# Patient Record
Sex: Female | Born: 1980 | Race: Black or African American | Hispanic: No | Marital: Single | Smoking: Current some day smoker
Health system: Southern US, Community
[De-identification: ages and names within clinical notes are randomized; demographics above are authoritative.]

## PROBLEM LIST (undated history)

## (undated) DIAGNOSIS — N939 Abnormal uterine and vaginal bleeding, unspecified: Secondary | ICD-10-CM

## (undated) DIAGNOSIS — D649 Anemia, unspecified: Secondary | ICD-10-CM

## (undated) HISTORY — PX: TUBAL LIGATION: SHX77

---

## 2018-03-31 ENCOUNTER — Encounter (HOSPITAL_COMMUNITY): Payer: Self-pay | Admitting: Emergency Medicine

## 2018-03-31 ENCOUNTER — Other Ambulatory Visit: Payer: Self-pay

## 2018-03-31 ENCOUNTER — Ambulatory Visit (HOSPITAL_COMMUNITY)
Admission: EM | Admit: 2018-03-31 | Discharge: 2018-03-31 | Disposition: A | Discharge status: Home / Self Care | Payer: Medicaid Other | Attending: Family Medicine | Admitting: Family Medicine

## 2018-03-31 DIAGNOSIS — Z3202 Encounter for pregnancy test, result negative: Secondary | ICD-10-CM | POA: Diagnosis not present

## 2018-03-31 DIAGNOSIS — N939 Abnormal uterine and vaginal bleeding, unspecified: Secondary | ICD-10-CM | POA: Diagnosis not present

## 2018-03-31 LAB — POCT I-STAT, CHEM 8
BUN: 10 mg/dL (ref 6–20)
CALCIUM ION: 1.19 mmol/L (ref 1.15–1.40)
CREATININE: 0.8 mg/dL (ref 0.44–1.00)
Chloride: 103 mmol/L (ref 98–111)
GLUCOSE: 99 mg/dL (ref 70–99)
HCT: 24 % — ABNORMAL LOW (ref 36.0–46.0)
HEMOGLOBIN: 8.2 g/dL — AB (ref 12.0–15.0)
Potassium: 3.9 mmol/L (ref 3.5–5.1)
Sodium: 139 mmol/L (ref 135–145)
TCO2: 27 mmol/L (ref 22–32)

## 2018-03-31 MED ORDER — MEDROXYPROGESTERONE ACETATE 10 MG PO TABS: 10.0000 mg | ORAL_TABLET | Freq: Every day | ORAL | 0 refills | Status: DC

## 2018-03-31 NOTE — ED Provider Notes (Signed)
[facility]    CSN: [id number] Arrival date & time: 03/31/18  1142     History   Chief Complaint Chief Complaint  Patient presents with  . Vaginal Bleeding    HPI Savannah Mayer is a 37 y.o. female.   37 year old female comes in for abnormal uterine bleeding.  States about 18 days ago, started having vaginal bleeding, states felt like her usual cycle, though it was 1 week early.  This lasted for about 4 to 5 days where she needed to use 3 tampons a day, which is at baseline.  States her usual cycles lasts about 7 days.  However, after 5 days of vaginal bleeding that is similar to her normal cycle, started having increase in flow, and blood clots.  States has since needed to switch heavy pad every 2 hours in addition to tissues that she puts on top of the pads.  She denies any abdominal pain, nausea, vomiting.  Denies fever, chills, night sweats.  Denies urinary symptoms such as frequency, dysuria, hematuria.  History of tubal ligation with Essure implant 14 years ago without obvious problems.  Denies syncope, but has felt weaker/lightheaded at times.  She has also felt more pale than usual.      History reviewed. No pertinent past medical history.  There are no active problems to display for this patient.   Past Surgical History:  Procedure Laterality Date  . TUBAL LIGATION      OB History   None      Home Medications    Prior to Admission medications   Medication Sig Start Date End Date Taking? Authorizing Provider  medroxyPROGESTERone (PROVERA) 10 MG tablet Take 1 tablet (10 mg total) by mouth daily. 03/31/18   Ok Edwards, PA-C    Family History Family History  Problem Relation Age of Onset  . Anemia Mother   . Healthy Mother     Social History Social History   Tobacco Use  . Smoking status: Current Some Day Smoker    Types: Cigars  . Smokeless tobacco: Never Used  Substance Use Topics  . Alcohol use: Yes  . Drug use: Never     Allergies    Patient has no known allergies.   Review of Systems Review of Systems  Reason unable to perform ROS: See HPI as above.     Physical Exam Triage Vital Signs ED Triage Vitals [03/31/18 1244]  Enc Vitals Group     BP 127/82     Pulse Rate 84     Resp      Temp 98 F (36.7 C)     Temp Source Oral     SpO2 100 %     Weight      Height      Head Circumference      Peak Flow      Pain Score 0     Pain Loc      Pain Edu?      Excl. in [place]?    No data found.  Updated Vital Signs BP 127/82 (BP Location: Left Arm)   Pulse 84   Temp 98 F (36.7 C) (Oral)   LMP 03/17/2018 (Approximate)   SpO2 100%   Physical Exam  Constitutional: She is oriented to person, place, and time. She appears well-developed and well-nourished. No distress.  HENT:  Head: Normocephalic and atraumatic.  Eyes: Pupils are equal, round, and reactive to light. Conjunctivae are normal.  Cardiovascular: Normal rate, regular rhythm and  normal heart sounds. Exam reveals no gallop and no friction rub.  No murmur heard. Pulmonary/Chest: Effort normal and breath sounds normal. She has no wheezes. She has no rales.  Abdominal: Soft. Bowel sounds are normal. She exhibits no mass. There is no tenderness. There is no rebound, no guarding and no CVA tenderness.  Neurological: She is alert and oriented to person, place, and time.  Skin: Skin is warm and dry.  Psychiatric: She has a normal mood and affect. Her behavior is normal. Judgment normal.     UC Treatments / Results  Labs (all labs ordered are listed, but only abnormal results are displayed) Labs Reviewed  POCT I-STAT, CHEM 8 - Abnormal; Notable for the following components:      Result Value   Hemoglobin 8.2 (*)    HCT 24.0 (*)    All other components within normal limits    EKG None  Radiology No results found.  Procedures Procedures (including critical care time)  Medications Ordered in UC Medications - No data to display  Initial  Impression / Assessment and Plan / UC Course  I have reviewed the triage vital signs and the nursing notes.  Pertinent labs & imaging results that were available during my care of the patient were reviewed by me and considered in my medical decision making (see chart for details).    Patient was unable to provide urine sample for pregnancy test. Discussed with Dr Meda Coffee, given history of Essure tubal ligation x 14 years, can defer pregnancy test. Patient with hgb 8.2 on istat, down from 11.9 on 08/2017. Patient is stable, without tachycardia, able to ambulate on own without difficulty. Start provera for abnormal uterine bleeding. Push fluids. Patient to follow up with GYN for further evaluation of abnormal uterine bleeding.  Final Clinical Impressions(s) / UC Diagnoses   Final diagnoses:  Abnormal uterine bleeding    ED Prescriptions    Medication Sig Dispense Auth. Provider   medroxyPROGESTERone (PROVERA) 10 MG tablet Take 1 tablet (10 mg total) by mouth daily. 10 tablet Tobin Chad, PA-C 03/31/18 [phone]

## 2018-03-31 NOTE — ED Notes (Signed)
Patient unable to void at this time. Patient drinking ginger ale

## 2018-03-31 NOTE — Discharge Instructions (Signed)
Start Provera as directed. Keep hydrated, your urine should be clear to pale yellow in color.  Follow-up with GYN for further evaluation of abnormal uterine bleeding.

## 2018-03-31 NOTE — ED Triage Notes (Addendum)
Pt reports vaginal bleeding x18 days.  She states she has had a really heavy flow w/ clotting and extended cycle.  Pt was at work today when she was feeling faint.  Pt does have the Esure tubal implant. She also reports that her mother passed out and had to have transfusions for excessive menstrual bleeding.  She was 52 at the time.

## 2018-04-09 ENCOUNTER — Encounter (HOSPITAL_COMMUNITY): Payer: Self-pay

## 2018-04-09 ENCOUNTER — Emergency Department (HOSPITAL_COMMUNITY)
Admission: EM | Admit: 2018-04-09 | Discharge: 2018-04-10 | Disposition: A | Discharge status: Home / Self Care | Payer: Medicaid Other | Attending: Emergency Medicine | Admitting: Emergency Medicine

## 2018-04-09 DIAGNOSIS — R531 Weakness: Secondary | ICD-10-CM | POA: Diagnosis present

## 2018-04-09 DIAGNOSIS — N939 Abnormal uterine and vaginal bleeding, unspecified: Secondary | ICD-10-CM

## 2018-04-09 DIAGNOSIS — Z79899 Other long term (current) drug therapy: Secondary | ICD-10-CM | POA: Insufficient documentation

## 2018-04-09 DIAGNOSIS — R0602 Shortness of breath: Secondary | ICD-10-CM | POA: Diagnosis not present

## 2018-04-09 DIAGNOSIS — F17210 Nicotine dependence, cigarettes, uncomplicated: Secondary | ICD-10-CM | POA: Insufficient documentation

## 2018-04-09 DIAGNOSIS — D649 Anemia, unspecified: Secondary | ICD-10-CM | POA: Insufficient documentation

## 2018-04-09 LAB — COMPREHENSIVE METABOLIC PANEL
ALBUMIN: 3.8 g/dL (ref 3.5–5.0)
ALT: 12 U/L (ref 0–44)
ANION GAP: 8 (ref 5–15)
AST: 18 U/L (ref 15–41)
Alkaline Phosphatase: 55 U/L (ref 38–126)
BUN: 10 mg/dL (ref 6–20)
CO2: 21 mmol/L — ABNORMAL LOW (ref 22–32)
Calcium: 8.9 mg/dL (ref 8.9–10.3)
Chloride: 106 mmol/L (ref 98–111)
Creatinine, Ser: 0.66 mg/dL (ref 0.44–1.00)
GFR calc Af Amer: >60 mL/min (ref >60)
Glucose, Bld: 86 mg/dL (ref 70–99)
POTASSIUM: 3.8 mmol/L (ref 3.5–5.1)
Sodium: 135 mmol/L (ref 135–145)
TOTAL PROTEIN: 6.7 g/dL (ref 6.5–8.1)
Total Bilirubin: 0.4 mg/dL (ref 0.3–1.2)

## 2018-04-09 LAB — PREPARE RBC (CROSSMATCH)

## 2018-04-09 LAB — ABO/RH: ABO/RH(D): O POS

## 2018-04-09 MED ORDER — SODIUM CHLORIDE 0.9% IV SOLUTION: Freq: Once | INTRAVENOUS | Status: DC

## 2018-04-09 MED ORDER — ACETAMINOPHEN 500 MG PO TABS
1000.0000 mg | ORAL_TABLET | Freq: Once | ORAL | Status: AC
  Administered 2018-04-09: 1000 mg via ORAL
  Filled 2018-04-09: qty 2

## 2018-04-09 NOTE — ED Provider Notes (Signed)
Patient signed out to me at shift change.  Patient presented with headache x1 week.  She states that she has poor posture and has tension in her neck that she believes causes her headache.  She denies any fever or chills.  Denies neck stiffness.  She is tried using Aleve with some relief.  She also reports feeling very fatigued and gets out of breath after walking about 500 feet.  Work-up today shows anemia of 5.7.  Patient reports that she has had abnormal uterine bleeding, which has now stopped.  She states that it stopped last Friday.  She reports that the fatigue has remained.  Patient declines admission, but is willing to receive blood transfusion.  Will treat headache with Tylenol.  Will reassess patient after transfusion.  2:21 AM Patient reassessed, feels significantly improved.  Ambulates around the emergency department without any symptoms.  States she wishes to go home.  Recommend recheck of CBC next week.  Patient understands and agrees with the plan.   Montine Circle, PA-C 04/10/18 Joylene Draft, MD 04/14/18 [phone]

## 2018-04-09 NOTE — ED Triage Notes (Signed)
Pt. Reports being recently treated for menstural bleeding and her hemoglobin was 8 a couple weeks ago. Pt. Reports being on a hormone medication for the bleeding and is now experiencing a severe headache. Pt. Reports shob and diziness. A/O X4

## 2018-04-09 NOTE — ED Provider Notes (Addendum)
[facility] EMERGENCY DEPARTMENT Provider Note   CSN: [id number] Arrival date & time: 04/09/18  1730     History   Chief Complaint No chief complaint on file.   HPI Journey Castonguay is a 37 y.o. female.  The history is provided by the patient.  Weakness  Primary symptoms comment: weak. This is a new problem. The current episode started more than 1 week ago. The problem has been gradually worsening. There was no focality noted. There has been no fever. Associated symptoms include shortness of breath.  Pt reports she was told 3 weeks ago that her hemoglobin was low at 8.8.  Pt has been having heavy periods.  Pt was placed on provera.   Pt reports increasing shortness of breath with exertion.  Pt also complains of a headache and [ain in the back of her neck   History reviewed. No pertinent past medical history.  There are no active problems to display for this patient.   Past Surgical History:  Procedure Laterality Date  . TUBAL LIGATION       OB History   None      Home Medications    Prior to Admission medications   Medication Sig Start Date End Date Taking? Authorizing Provider  medroxyPROGESTERone (PROVERA) 10 MG tablet Take 1 tablet (10 mg total) by mouth daily. 03/31/18   Ok Edwards, PA-C    Family History Family History  Problem Relation Age of Onset  . Anemia Mother   . Healthy Mother     Social History Social History   Tobacco Use  . Smoking status: Current Some Day Smoker    Types: Cigars  . Smokeless tobacco: Never Used  Substance Use Topics  . Alcohol use: Yes  . Drug use: Never     Allergies   Patient has no known allergies.   Review of Systems Review of Systems  Respiratory: Positive for shortness of breath.   Neurological: Positive for weakness.  All other systems reviewed and are negative.    Physical Exam Updated Vital Signs BP 132/73   Pulse 80   Temp 97.9 F (36.6 C) (Oral)   Resp 18   Ht 6\' 1"  (1.854  m)   Wt 78 kg   LMP 03/17/2018 (Approximate)   SpO2 100%   BMI 22.69 kg/m   Physical Exam  Constitutional: She is oriented to person, place, and time. She appears well-developed and well-nourished.  HENT:  Head: Normocephalic.  Pale conjunctiva  Eyes: Pupils are equal, round, and reactive to light. Conjunctivae and EOM are normal.  Neck: Normal range of motion.  Cardiovascular: Normal rate and regular rhythm.  Pulmonary/Chest: Effort normal.  Abdominal: Soft. She exhibits no distension.  Musculoskeletal: Normal range of motion.  Neurological: She is alert and oriented to person, place, and time.  Skin: Skin is warm.  Psychiatric: She has a normal mood and affect.  Nursing note and vitals reviewed.    ED Treatments / Results  Labs (all labs ordered are listed, but only abnormal results are displayed) Labs Reviewed  CBC WITH DIFFERENTIAL/PLATELET - Abnormal; Notable for the following components:      Result Value   WBC 14.6 (*)    RBC 3.02 (*)    Hemoglobin 5.7 (*)    HCT 20.5 (*)    MCV 67.9 (*)    MCH 18.9 (*)    MCHC 27.8 (*)    RDW 19.0 (*)    Platelets 498 (*)  Neutro Abs 11.4 (*)    All other components within normal limits  COMPREHENSIVE METABOLIC PANEL - Abnormal; Notable for the following components:   CO2 21 (*)    All other components within normal limits  TYPE AND SCREEN  PREPARE RBC (CROSSMATCH)    EKG EKG Interpretation  Date/Time:  Wednesday April 09 2018 18:49:48 EST Ventricular Rate:  77 PR Interval:  138 QRS Duration: 72 QT Interval:  378 QTC Calculation: 427 R Axis:   83 Text Interpretation:  Normal sinus rhythm Normal ECG No old tracing to compare Confirmed by Deno Etienne [phone]) on 04/09/2018 6:05:01 PM   Radiology No results found.  Procedures .Critical Care Performed by: Fransico Meadow, PA-C Authorized by: Fransico Meadow, PA-C   Critical care provider statement:    Critical care time (minutes):  45   Critical care  start time:  04/09/2018 7:00 PM   Critical care end time:  04/09/2018 9:07 PM   Critical care was time spent personally by me on the following activities:  Blood draw for specimens, discussions with consultants, discussions with primary provider, ordering and performing treatments and interventions, ordering and review of laboratory studies, ordering and review of radiographic studies, pulse oximetry, evaluation of patient's response to treatment, examination of patient and interpretation of cardiac output measurements   (including critical care time)  Medications Ordered in ED Medications  0.9 %  sodium chloride infusion (Manually program via Guardrails IV Fluids) (has no administration in time range)     Initial Impression / Assessment and Plan / ED Course  I have reviewed the triage vital signs and the nursing notes.  Pertinent labs & imaging results that were available during my care of the patient were reviewed by me and considered in my medical decision making (see chart for details).     Blood transfusion ordered.  Pt refuses admission.  Pt willing to have a transfusion but does not want to be admitted.  Two units ordered.  [facility] for evaluation.  Go to [facility] if further heavy vaginal bleeding  Final Clinical Impressions(s) / ED Diagnoses   Final diagnoses:  Anemia, unspecified type  Abnormal vaginal bleeding    ED Discharge Orders    None    An After Visit Summary was printed and given to the patient.    Fransico Meadow, [place] 04/09/18 2108  Pt's care turned over to [employer] at 11pm.    Fransico Meadow, PA-C 04/09/18 [place], [place], DO 04/12/18 [phone]

## 2018-04-10 LAB — CBC WITH DIFFERENTIAL/PLATELET
ABS IMMATURE GRANULOCYTES: 0 10*3/uL (ref 0.00–0.07)
Abs Immature Granulocytes: 0.05 10*3/uL (ref 0.00–0.07)
BASOS ABS: 0 10*3/uL (ref 0.0–0.1)
BASOS PCT: 0 %
BASOS PCT: 0 %
Basophils Absolute: 0 10*3/uL (ref 0.0–0.1)
EOS ABS: 0.1 10*3/uL (ref 0.0–0.5)
Eosinophils Absolute: 0.1 10*3/uL (ref 0.0–0.5)
Eosinophils Relative: 1 %
Eosinophils Relative: 1 %
HCT: 20.5 % — ABNORMAL LOW (ref 36.0–46.0)
HEMATOCRIT: 25.1 % — AB (ref 36.0–46.0)
HEMOGLOBIN: 5.7 g/dL — AB (ref 12.0–15.0)
Hemoglobin: 7.5 g/dL — ABNORMAL LOW (ref 12.0–15.0)
IMMATURE GRANULOCYTES: 0 %
LYMPHS ABS: 2.5 10*3/uL (ref 0.7–4.0)
Lymphocytes Relative: 17 %
Lymphocytes Relative: 18 %
Lymphs Abs: 2.5 10*3/uL (ref 0.7–4.0)
MCH: 18.9 pg — ABNORMAL LOW (ref 26.0–34.0)
MCH: 21.6 pg — ABNORMAL LOW (ref 26.0–34.0)
MCHC: 27.8 g/dL — AB (ref 30.0–36.0)
MCHC: 29.9 g/dL — ABNORMAL LOW (ref 30.0–36.0)
MCV: 67.9 fL — ABNORMAL LOW (ref 80.0–100.0)
MCV: 72.3 fL — AB (ref 80.0–100.0)
MONOS PCT: 4 %
MONOS PCT: 9 %
Monocytes Absolute: 0.6 10*3/uL (ref 0.1–1.0)
Monocytes Absolute: 1.2 10*3/uL — ABNORMAL HIGH (ref 0.1–1.0)
NEUTROS ABS: 11.4 10*3/uL — AB (ref 1.7–7.7)
NEUTROS ABS: 9.8 10*3/uL — AB (ref 1.7–7.7)
NEUTROS PCT: 78 %
NEUTROS PCT: 72 %
PLATELETS: 498 10*3/uL — AB (ref 150–400)
PLATELETS: 422 10*3/uL — AB (ref 150–400)
RBC: 3.02 MIL/uL — ABNORMAL LOW (ref 3.87–5.11)
RBC: 3.47 MIL/uL — ABNORMAL LOW (ref 3.87–5.11)
RDW: 19 % — AB (ref 11.5–15.5)
RDW: 21.7 % — ABNORMAL HIGH (ref 11.5–15.5)
WBC: 14.6 10*3/uL — ABNORMAL HIGH (ref 4.0–10.5)
WBC: 13.6 10*3/uL — ABNORMAL HIGH (ref 4.0–10.5)
nRBC: 0 % (ref 0.0–0.2)
nRBC: 0 /100 WBC
nRBC: 0 % (ref 0.0–0.2)

## 2018-04-10 LAB — TYPE AND SCREEN
ABO/RH(D): O POS
Antibody Screen: NEGATIVE
UNIT DIVISION: 0
UNIT DIVISION: 0

## 2018-04-10 LAB — BPAM RBC
BLOOD PRODUCT EXPIRATION DATE: 201912252359
Blood Product Expiration Date: 201912252359
ISSUE DATE / TIME: 201911272252
ISSUE DATE / TIME: 201911272054
UNIT TYPE AND RH: 5100
Unit Type and Rh: 5100

## 2018-04-10 MED ORDER — FERROUS SULFATE 325 (65 FE) MG PO TABS: 325.0000 mg | ORAL_TABLET | Freq: Every day | ORAL | 0 refills | Status: DC

## 2018-04-10 NOTE — ED Notes (Signed)
Pt ambulated around the pod on pulse ox with no complaints at this time. Pt remained between 100% and 97% while ambulating. Pt stated that she could tell that she felt better after the blood transfusions. Nurse and MD made aware

## 2019-01-15 ENCOUNTER — Observation Stay (HOSPITAL_COMMUNITY)
Admission: EM | Admit: 2019-01-15 | Discharge: 2019-01-16 | Disposition: A | Discharge status: Home / Self Care | Payer: Medicaid Other | Attending: Obstetrics & Gynecology | Admitting: Obstetrics & Gynecology

## 2019-01-15 ENCOUNTER — Other Ambulatory Visit: Payer: Self-pay

## 2019-01-15 ENCOUNTER — Observation Stay (HOSPITAL_COMMUNITY): Payer: Medicaid Other

## 2019-01-15 DIAGNOSIS — N83202 Unspecified ovarian cyst, left side: Secondary | ICD-10-CM | POA: Insufficient documentation

## 2019-01-15 DIAGNOSIS — N939 Abnormal uterine and vaginal bleeding, unspecified: Secondary | ICD-10-CM

## 2019-01-15 DIAGNOSIS — N83291 Other ovarian cyst, right side: Secondary | ICD-10-CM | POA: Diagnosis not present

## 2019-01-15 DIAGNOSIS — R42 Dizziness and giddiness: Secondary | ICD-10-CM | POA: Diagnosis not present

## 2019-01-15 DIAGNOSIS — F17290 Nicotine dependence, other tobacco product, uncomplicated: Secondary | ICD-10-CM | POA: Diagnosis not present

## 2019-01-15 DIAGNOSIS — Z20828 Contact with and (suspected) exposure to other viral communicable diseases: Secondary | ICD-10-CM | POA: Insufficient documentation

## 2019-01-15 DIAGNOSIS — D649 Anemia, unspecified: Secondary | ICD-10-CM | POA: Diagnosis not present

## 2019-01-15 DIAGNOSIS — N938 Other specified abnormal uterine and vaginal bleeding: Principal | ICD-10-CM | POA: Diagnosis present

## 2019-01-15 LAB — URINALYSIS, ROUTINE W REFLEX MICROSCOPIC
Bilirubin Urine: NEGATIVE
Glucose, UA: NEGATIVE mg/dL
Hgb urine dipstick: NEGATIVE
Ketones, ur: NEGATIVE mg/dL
Leukocytes,Ua: NEGATIVE
Nitrite: NEGATIVE
Protein, ur: NEGATIVE mg/dL
Specific Gravity, Urine: 1.018 (ref 1.005–1.030)
pH: 7 (ref 5.0–8.0)

## 2019-01-15 LAB — I-STAT BETA HCG BLOOD, ED (MC, WL, AP ONLY): I-stat hCG, quantitative: <5 m[IU]/mL (ref <5)

## 2019-01-15 LAB — COMPREHENSIVE METABOLIC PANEL
ALT: 11 U/L (ref 0–44)
AST: 15 U/L (ref 15–41)
Albumin: 3.4 g/dL — ABNORMAL LOW (ref 3.5–5.0)
Alkaline Phosphatase: 60 U/L (ref 38–126)
Anion gap: 8 (ref 5–15)
BUN: 9 mg/dL (ref 6–20)
CO2: 23 mmol/L (ref 22–32)
Calcium: 8.7 mg/dL — ABNORMAL LOW (ref 8.9–10.3)
Chloride: 106 mmol/L (ref 98–111)
Creatinine, Ser: 0.77 mg/dL (ref 0.44–1.00)
GFR calc Af Amer: >60 mL/min (ref >60)
GFR calc non Af Amer: >60 mL/min (ref >60)
Glucose, Bld: 92 mg/dL (ref 70–99)
Potassium: 4 mmol/L (ref 3.5–5.1)
Sodium: 137 mmol/L (ref 135–145)
Total Bilirubin: 0.3 mg/dL (ref 0.3–1.2)
Total Protein: 6.2 g/dL — ABNORMAL LOW (ref 6.5–8.1)

## 2019-01-15 LAB — CBC
HCT: 21 % — ABNORMAL LOW (ref 36.0–46.0)
Hemoglobin: 5.9 g/dL — CL (ref 12.0–15.0)
MCH: 18.4 pg — ABNORMAL LOW (ref 26.0–34.0)
MCHC: 28.1 g/dL — ABNORMAL LOW (ref 30.0–36.0)
MCV: 65.6 fL — ABNORMAL LOW (ref 80.0–100.0)
Platelets: 497 10*3/uL — ABNORMAL HIGH (ref 150–400)
RBC: 3.2 MIL/uL — ABNORMAL LOW (ref 3.87–5.11)
RDW: 20.6 % — ABNORMAL HIGH (ref 11.5–15.5)
WBC: 11.3 10*3/uL — ABNORMAL HIGH (ref 4.0–10.5)
nRBC: 0.3 % — ABNORMAL HIGH (ref 0.0–0.2)

## 2019-01-15 LAB — PROTIME-INR
INR: 1.1 (ref 0.8–1.2)
Prothrombin Time: 13.9 seconds (ref 11.4–15.2)

## 2019-01-15 LAB — WET PREP, GENITAL
Clue Cells Wet Prep HPF POC: NONE SEEN
Sperm: NONE SEEN
Trich, Wet Prep: NONE SEEN
WBC, Wet Prep HPF POC: NONE SEEN
Yeast Wet Prep HPF POC: NONE SEEN

## 2019-01-15 LAB — SARS CORONAVIRUS 2 BY RT PCR (HOSPITAL ORDER, PERFORMED IN ~~LOC~~ HOSPITAL LAB): SARS Coronavirus 2: NEGATIVE

## 2019-01-15 LAB — APTT: aPTT: 28 seconds (ref 24–36)

## 2019-01-15 LAB — PREPARE RBC (CROSSMATCH)

## 2019-01-15 MED ORDER — ESTROGENS CONJUGATED 25 MG IJ SOLR
25.0000 mg | Freq: Once | INTRAMUSCULAR | Status: AC
  Administered 2019-01-15: 25 mg via INTRAVENOUS
  Filled 2019-01-15: qty 25

## 2019-01-15 MED ORDER — MEGESTROL ACETATE 40 MG PO TABS
40.0000 mg | ORAL_TABLET | Freq: Every day | ORAL | Status: DC
  Administered 2019-01-15 – 2019-01-16 (×2): 40 mg via ORAL
  Filled 2019-01-15 (×2): qty 1

## 2019-01-15 MED ORDER — ACETAMINOPHEN 325 MG PO TABS: 650.0000 mg | ORAL_TABLET | Freq: Once | ORAL | Status: DC

## 2019-01-15 MED ORDER — STERILE WATER FOR INJECTION IJ SOLN
INTRAMUSCULAR | Status: AC
  Administered 2019-01-15: 23:00:00
  Filled 2019-01-15: qty 10

## 2019-01-15 MED ORDER — SODIUM CHLORIDE 0.9% IV SOLUTION
Freq: Once | INTRAVENOUS | Status: AC
  Administered 2019-01-16: 02:00:00 via INTRAVENOUS

## 2019-01-15 MED ORDER — PRENATAL MULTIVITAMIN CH
1.0000 | ORAL_TABLET | Freq: Every day | ORAL | Status: DC
  Administered 2019-01-16: 1 via ORAL
  Filled 2019-01-15: qty 1

## 2019-01-15 NOTE — ED Triage Notes (Signed)
Pt presents with bright red vaginal bleeding x3 weeks. Reports SOB with exertion, takes iron pills

## 2019-01-15 NOTE — H&P (Signed)
Savannah Mayer is an 38 y.o. female G6P5A1 LMP 12/26/18 with past medical history significant for anemia, dysfunctional uterine bleeding, who presents for evaluation of lightheadedness.  Patient states she has had her menstrual cycle x3 weeks. States she is having to change her pad approximately every 1-2 hours.  Patient states she is having clots the size of quarters.  Patient states her bleeding has slowed down somewhat since it began however she is still changing her pads less than every 2 hours.  She has had some dizziness and gets short of breath since her vaginal bleeding began.  States she does have a headache located to her posterior head.  Patient states this is a similar headache that she got in November of last year when he was diagnosed with anemia.  She denies sudden onset thunderclap headache, neck pain, neck stiffness, unilateral weakness, blurred vision.  Patient describes the pain as dull and nagging.  Denies chest pain, hemoptysis, fever, chills, abdominal pain, diarrhea dysuria.  Patient states she is sexually active and intermittently uses protection.  States prior to her vaginal bleeding began she did have a thick vaginal discharge.  Was previously on Provera for vaginal bleeding.  Not followed by PCP or OB/GYN.  She has a prior history of tubal ligation.  Denies additional aggravating or alleviating factors. Pertinent Gynecological History: Bleeding: dysfunctional uterine bleeding Contraception: tubal ligation Essure and PP BTL 10 years ago Blood transfusions: 2 units PRBC 03/2018 Sexually transmitted diseases: no past history Previous GYN Procedures: Essure, BTL    Menstrual History:  Patient's last menstrual period was 01/15/2019.  Menses generally are every 30 days and last 4 days without pain  No past medical history on file.  Past Surgical History:  Procedure Laterality Date  . TUBAL LIGATION    Essure  Family History  Problem Relation Age of Onset  . Anemia Mother    . Healthy Mother     Social History:  reports that she has been smoking cigars. She has never used smokeless tobacco. She reports current alcohol use. She reports that she does not use drugs.  Allergies: No Known Allergies  (Not in a hospital admission)   ROS  Blood pressure 113/69, pulse 83, temperature 98.3 F (36.8 C), temperature source Oral, resp. rate 16, height 6\' 1"  (1.854 m), weight 83.9 kg, last menstrual period 01/15/2019, SpO2 100 %. Physical Exam  Vitals reviewed. Constitutional: She is oriented to person, place, and time. She appears well-developed. No distress.  HENT:  Head: Normocephalic.  Neck: Normal range of motion. Neck supple.  Cardiovascular: Normal rate.  Respiratory: She is in respiratory distress.  GI: Soft. She exhibits no distension and no mass. There is no abdominal tenderness.  Musculoskeletal: Normal range of motion.  Neurological: She is alert and oriented to person, place, and time.  Skin: Skin is warm and dry. There is pallor.    Results for orders placed or performed during the hospital encounter of 01/15/19 (from the past 24 hour(s))  CBC     Status: Abnormal   Collection Time: 01/15/19  5:45 PM  Result Value Ref Range   WBC 11.3 (H) 4.0 - 10.5 K/uL   RBC 3.20 (L) 3.87 - 5.11 MIL/uL   Hemoglobin 5.9 (LL) 12.0 - 15.0 g/dL   HCT 21.0 (L) 36.0 - 46.0 %   MCV 65.6 (L) 80.0 - 100.0 fL   MCH 18.4 (L) 26.0 - 34.0 pg   MCHC 28.1 (L) 30.0 - 36.0 g/dL   RDW 20.6 (  H) 11.5 - 15.5 %   Platelets 497 (H) 150 - 400 K/uL   nRBC 0.3 (H) 0.0 - 0.2 %  I-Stat beta hCG blood, ED     Status: None   Collection Time: 01/15/19  5:59 PM  Result Value Ref Range   I-stat hCG, quantitative <5.0 <5 mIU/mL   Comment 3          Comprehensive metabolic panel     Status: Abnormal   Collection Time: 01/15/19  7:20 PM  Result Value Ref Range   Sodium 137 135 - 145 mmol/L   Potassium 4.0 3.5 - 5.1 mmol/L   Chloride 106 98 - 111 mmol/L   CO2 23 22 - 32 mmol/L    Glucose, Bld 92 70 - 99 mg/dL   BUN 9 6 - 20 mg/dL   Creatinine, Ser 0.77 0.44 - 1.00 mg/dL   Calcium 8.7 (L) 8.9 - 10.3 mg/dL   Total Protein 6.2 (L) 6.5 - 8.1 g/dL   Albumin 3.4 (L) 3.5 - 5.0 g/dL   AST 15 15 - 41 U/L   ALT 11 0 - 44 U/L   Alkaline Phosphatase 60 38 - 126 U/L   Total Bilirubin 0.3 0.3 - 1.2 mg/dL   GFR calc non Af Amer >60 >60 mL/min   GFR calc Af Amer >60 >60 mL/min   Anion gap 8 5 - 15  Protime-INR     Status: None   Collection Time: 01/15/19  7:20 PM  Result Value Ref Range   Prothrombin Time 13.9 11.4 - 15.2 seconds   INR 1.1 0.8 - 1.2  PTT     Status: None   Collection Time: 01/15/19  7:20 PM  Result Value Ref Range   aPTT 28 24 - 36 seconds  Type and screen [facility]     Status: None   Collection Time: 01/15/19  7:30 PM  Result Value Ref Range   ABO/RH(D) O POS    Antibody Screen POS    Sample Expiration      01/18/2019,2359 Performed at [facility], [address] [place], [place] [address]   Prepare RBC     Status: None   Collection Time: 01/15/19  7:30 PM  Result Value Ref Range   Order Confirmation      ORDER PROCESSED BY BLOOD BANK Performed at [facility], [place] [address] [place],  [address]   SARS Coronavirus 2 [facility] order, Performed in [facility] hospital lab) Nasopharyngeal Nasopharyngeal Swab     Status: None   Collection Time: 01/15/19  7:30 PM   Specimen: Nasopharyngeal Swab  Result Value Ref Range   SARS Coronavirus 2 NEGATIVE NEGATIVE  Urinalysis, Routine w reflex microscopic     Status: Abnormal   Collection Time: 01/15/19  8:20 PM  Result Value Ref Range   Color, Urine YELLOW YELLOW   APPearance HAZY (A) CLEAR   Specific Gravity, Urine 1.018 1.005 - 1.030   pH 7.0 5.0 - 8.0   Glucose, UA NEGATIVE NEGATIVE mg/dL   Hgb urine dipstick NEGATIVE NEGATIVE   Bilirubin Urine NEGATIVE NEGATIVE   Ketones, ur NEGATIVE NEGATIVE mg/dL   Protein, ur NEGATIVE NEGATIVE mg/dL   Nitrite  NEGATIVE NEGATIVE   Leukocytes,Ua NEGATIVE NEGATIVE    No results found.  Assessment/Plan: DUB with anemia requiring transfusion. 2 units PRBC was ordered and she will receive IV Premarin and oral Megace to control her current bleeding. Pelvic US ordered for tomorrow morning.  Emeterio Reeve 01/15/2019, 9:00 PM

## 2019-01-15 NOTE — ED Provider Notes (Signed)
[facility] DEPARTMENT Provider Note   CSN: [id number] Arrival date & time: 01/15/19  1720   History   Chief Complaint Chief Complaint  Patient presents with   Vaginal Bleeding   HPI Savannah Mayer is a 38 y.o. female with past medical history significant for anemia, dysfunctional uterine bleeding, who presents for evaluation of lightheadedness.  Patient states she has had her menstrual cycle x3 weeks. States she is having to change her pad approximately every 1-2 hours.  Patient states she is having clots the size of quarters.  Patient states her bleeding has slowed down somewhat since it began however she is still changing her pads less than every 2 hours.  She has had some dizziness and gets short of breath since her vaginal bleeding began.  States she does have a headache located to her posterior head.  Patient states this is a similar headache that she got in November of last year when he was diagnosed with anemia.  She denies sudden onset thunderclap headache, neck pain, neck stiffness, unilateral weakness, blurred vision.  Patient describes the pain as dull and nagging.  Denies chest pain, hemoptysis, fever, chills, abdominal pain, diarrhea dysuria.  Patient states she is sexually active and intermittently uses protection.  States prior to her vaginal bleeding began she did have a thick vaginal discharge.  Was previously on Provera for vaginal bleeding.  Not followed by PCP or OB/GYN.  She has a prior history of tubal ligation.  Denies additional aggravating or alleviating factors.  History obtained from patient and past medical records.  No interpreter was used.     HPI  No past medical history on file.  Patient Active Problem List   Diagnosis Date Noted   DUB (dysfunctional uterine bleeding) 01/15/2019    Past Surgical History:  Procedure Laterality Date   TUBAL LIGATION       OB History   No obstetric history on file.      Home  Medications    Prior to Admission medications   Medication Sig Start Date End Date Taking? Authorizing Provider  acetaminophen (TYLENOL) 325 MG suppository Place 325 mg rectally every 4 (four) hours as needed (for headache).   Yes [provider]  BIOTIN PO Take 1 tablet by mouth daily.   Yes [provider]  ferrous sulfate 325 (65 FE) MG tablet Take 1 tablet (325 mg total) by mouth daily. 04/10/18  Yes Montine Circle, PA-C  ibuprofen (ADVIL,MOTRIN) 200 MG tablet Take 200 mg by mouth every 6 (six) hours as needed for headache.   Yes [provider]  naproxen sodium (ALEVE) 220 MG tablet Take 220 mg by mouth 2 (two) times daily as needed (for headache).   Yes [provider]    Family History Family History  Problem Relation Age of Onset   Anemia Mother    Healthy Mother     Social History Social History   Tobacco Use   Smoking status: Current Some Day Smoker    Types: Cigars   Smokeless tobacco: Never Used  Substance Use Topics   Alcohol use: Yes   Drug use: Never     Allergies   Patient has no known allergies.   Review of Systems Review of Systems  Constitutional: Negative.   HENT: Negative.   Respiratory: Negative.   Cardiovascular: Negative.   Gastrointestinal: Negative.   Genitourinary: Negative.   Musculoskeletal: Negative.   Skin: Negative.   Neurological: Positive for dizziness, light-headedness and headaches.  Negative for tremors, seizures, syncope, speech difficulty, weakness and numbness.  All other systems reviewed and are negative.    Physical Exam Updated Vital Signs BP 128/71 (BP Location: Right Arm)    Pulse 67    Temp 98.3 F (36.8 C) (Oral)    Resp 12    Ht 6\' 1"  (1.854 m)    Wt 83.9 kg    LMP 01/15/2019    SpO2 100%    BMI 24.41 kg/m   Physical Exam Vitals signs and nursing note reviewed. Exam conducted with a chaperone present.  Constitutional:      General: She is not in acute distress.     Appearance: She is well-developed. She is not ill-appearing, toxic-appearing or diaphoretic.  HENT:     Head: Normocephalic and atraumatic.     Nose: Nose normal.     Mouth/Throat:     Mouth: Mucous membranes are dry.  Eyes:     Pupils: Pupils are equal, round, and reactive to light.     Comments: Conjunctival pallor. EOM intact. No nystagmus  Neck:     Musculoskeletal: Normal range of motion.     Comments: Full active and passive ROM without pain No midline or paraspinal tenderness No nuchal rigidity or meningeal signs  Cardiovascular:     Rate and Rhythm: Normal rate.     Pulses: Normal pulses.     Heart sounds: Normal heart sounds.  Pulmonary:     Effort: Pulmonary effort is normal. No respiratory distress.     Breath sounds: Normal breath sounds and air entry.  Abdominal:     General: Bowel sounds are normal. There is no distension.     Palpations: Abdomen is soft.     Tenderness: There is no abdominal tenderness.     Comments: Soft, Nontender without rebound or guarding.  Normoactive bowel sounds.  Genitourinary:    Comments: Normal appearing external female genitalia without rashes or lesions, normal vaginal epithelium. Normal appearing cervix with moderate blood at os. No cervical petechiae. Moderate Odor. Bimanual: No CMT, non-tender adenexa.  No palpable adnexal masses or tenderness. Uterus midline and not fixed. Rectovaginal exam was deferred.  No cystocele or rectocele noted. No pelvic lymphadenopathy noted. Wet prep was obtained.  Cultures for gonorrhea and chlamydia collected. Exam performed with chaperone in room. Musculoskeletal: Normal range of motion.     Comments: Moves all 4 extremities without difficulty.  Skin:    General: Skin is warm and dry.     Comments: Appears mildly pale.  No rashes or lesions.  Neurological:     Mental Status: She is alert.     Comments: Mental Status:  Alert, oriented, thought content appropriate. Speech fluent without evidence of  aphasia. Able to follow 2 step commands without difficulty.  Cranial Nerves:  II:  Peripheral visual fields grossly normal, pupils equal, round, reactive to light III,IV, VI: ptosis not present, extra-ocular motions intact bilaterally  V,VII: smile symmetric, facial light touch sensation equal VIII: hearing grossly normal bilaterally  IX,X: midline uvula rise  XI: bilateral shoulder shrug equal and strong XII: midline tongue extension  Motor:  5/5 in upper and lower extremities bilaterally including strong and equal grip strength and dorsiflexion/plantar flexion Sensory: Pinprick and light touch normal in all extremities.  Deep Tendon Reflexes: 2+ and symmetric  Cerebellar: normal finger-to-nose with bilateral upper extremities Gait: normal gait and balance CV: distal pulses palpable throughout      ED Treatments / Results  Labs (all labs ordered  are listed, but only abnormal results are displayed) Labs Reviewed  CBC - Abnormal; Notable for the following components:      Result Value   WBC 11.3 (*)    RBC 3.20 (*)    Hemoglobin 5.9 (*)    HCT 21.0 (*)    MCV 65.6 (*)    MCH 18.4 (*)    MCHC 28.1 (*)    RDW 20.6 (*)    Platelets 497 (*)    nRBC 0.3 (*)    All other components within normal limits  COMPREHENSIVE METABOLIC PANEL - Abnormal; Notable for the following components:   Calcium 8.7 (*)    Total Protein 6.2 (*)    Albumin 3.4 (*)    All other components within normal limits  URINALYSIS, ROUTINE W REFLEX MICROSCOPIC - Abnormal; Notable for the following components:   APPearance HAZY (*)    All other components within normal limits  WET PREP, GENITAL  SARS CORONAVIRUS 2 (HOSPITAL ORDER, [facility])  URINE CULTURE  PROTIME-INR  APTT  RPR  HIV ANTIBODY (ROUTINE TESTING W REFLEX)  I-STAT BETA HCG BLOOD, ED ([facility], [facility], [facility] ONLY)  TYPE AND SCREEN  PREPARE RBC (CROSSMATCH)  GC/CHLAMYDIA PROBE AMP ([place]) NOT AT [facility]     EKG None  Radiology No results found.  Procedures .Critical Care Performed by: Nettie Elm, PA-C Authorized by: Nettie Elm, PA-C   Critical care provider statement:    Critical care time (minutes):  45   Critical care time was exclusive of:  Separately billable procedures and treating other patients and teaching time   Critical care was necessary to treat or prevent imminent or life-threatening deterioration of the following conditions:  Metabolic crisis   Critical care was time spent personally by me on the following activities:  Discussions with consultants, evaluation of patient's response to treatment, examination of patient, ordering and performing treatments and interventions, ordering and review of laboratory studies, ordering and review of radiographic studies, pulse oximetry, re-evaluation of patient's condition, obtaining history from patient or surrogate and review of old charts   (including critical care time)  Medications Ordered in ED Medications  0.9 %  sodium chloride infusion (Manually program via Guardrails IV Fluids) (has no administration in time range)  acetaminophen (TYLENOL) tablet 650 mg (has no administration in time range)  megestrol (MEGACE) tablet 40 mg (has no administration in time range)  conjugated estrogens (PREMARIN) injection 25 mg (has no administration in time range)  prenatal multivitamin tablet 1 tablet (has no administration in time range)   Initial Impression / Assessment and Plan / ED Course  I have reviewed the triage vital signs and the nursing notes.  Pertinent labs & imaging results that were available during my care of the patient were reviewed by me and considered in my medical decision making (see chart for details).  38 year old female appears otherwise well presents for evaluation of dizziness.  Has had an extended menstrual cycle over the last 3 weeks.  She is having to change her pad every 1-2 hours.  She is  having quarter size clots.  No abdominal pain.  She has had a headache however she states normally when she gets anemic she gets this type headache.  She has a nonfocal neurologic exam without neurologic deficits.  She denies sudden onset thunderclap headache.  No neck stiffness or neck rigidity.  No meningismus.  She does appear pale.  Heart and lungs clear.  GU exam  with moderate blood at the office however no CMT tenderness, adnexal tenderness.  Likely due to anemia as she has had this previously.  Presentation non concerning for [facility], ICH, Meningitis, or temporal arteritis. Pt is afebrile with no focal neuro deficits, nuchal rigidity, or change in vision.  CBC with mild leukocytosis at 11.3, hemoglobin 5.9 Pregnancy test negative CMP without significant abnormality. HIV/RPR pending GC pending COVID negative Wet prep negative  Patient with symptomatic anemia. Given Megace in the ED. Discussed risk versus benefit of blood transfusion she is amenable to transfusion and admission to hospital.  Has had similar episode in November which required 2 units of blood.  She has not followed up with OB/GYN.  She does not have PCP follow-up.  2050: Consulted with Dr. Roselie Awkward with OB/GYN who accepts patient for admission.  He request 25 mg of Premarin in addition to Megace.  He will place admission orders.  Covid test is pending on admission.  The patient appears reasonably stabilized for admission considering the current resources, flow, and capabilities available in the ED at this time, and I doubt any other [facility] requiring further screening and/or treatment in the ED prior to admission.        Final Clinical Impressions(s) / ED Diagnoses   Final diagnoses:  Anemia, unspecified type  Abnormal uterine bleeding (AUB)    ED Discharge Orders    None       Jearldean Gutt A, PA-C 01/15/19 2112    Charlesetta Shanks, MD 01/17/19 1639

## 2019-01-15 NOTE — ED Notes (Signed)
Rusty from blood bank called to say that pt's blood is positive for antibodies and therefore will take longer to match/find blood.

## 2019-01-15 NOTE — ED Notes (Signed)
ED TO INPATIENT HANDOFF REPORT  ED Nurse Name and Phone #:  [phone]  S Name/Age/Gender Savannah Mayer 38 y.o. female Room/Bed: 030C/030C  Code Status   Code Status: Full Code  Home/SNF/Other Home Patient oriented to: self, place, time and situation Is this baseline? Yes   Triage Complete: Triage complete  Chief Complaint Low hemoglobin  Triage Note Pt presents with bright red vaginal bleeding x3 weeks. Reports SOB with exertion, takes iron pills    Allergies No Known Allergies  Level of Care/Admitting Diagnosis ED Disposition    ED Disposition Condition [facility] Area: [place] [[id number]]  Level of Care: Med-Surg [16]  Covid Evaluation: Confirmed COVID Negative  Diagnosis: DUB (dysfunctional uterine bleeding) [id number]  Admitting Physician: Rennis Harding  Attending Physician: Woodroe Mode [id number]  PT Class (Do Not Modify): Observation [104]  PT Acc Code (Do Not Modify): Observation [10022]       B Medical/Surgery History No past medical history on file. Past Surgical History:  Procedure Laterality Date  . TUBAL LIGATION       A IV Location/Drains/Wounds Patient Lines/Drains/Airways Status   Active Line/Drains/Airways    Name:   Placement date:   Placement time:   Site:   Days:   Peripheral IV 01/15/19 Left Hand   01/15/19    2000    Hand   less than 1          Intake/Output Last 24 hours No intake or output data in the 24 hours ending 01/15/19 2146  Labs/Imaging Results for orders placed or performed during the hospital encounter of 01/15/19 (from the past 48 hour(s))  CBC     Status: Abnormal   Collection Time: 01/15/19  5:45 PM  Result Value Ref Range   WBC 11.3 (H) 4.0 - 10.5 K/uL   RBC 3.20 (L) 3.87 - 5.11 MIL/uL   Hemoglobin 5.9 (LL) 12.0 - 15.0 g/dL    Comment: REPEATED TO VERIFY Reticulocyte Hemoglobin testing may be clinically indicated, consider ordering this additional test  [id number] THIS CRITICAL RESULT HAS VERIFIED AND BEEN CALLED TO A.SAUNDERS RN BY KATHERINE MCCORMICK ON 09 03 2020 AT 1828, AND HAS BEEN READ BACK.     HCT 21.0 (L) 36.0 - 46.0 %   MCV 65.6 (L) 80.0 - 100.0 fL   MCH 18.4 (L) 26.0 - 34.0 pg   MCHC 28.1 (L) 30.0 - 36.0 g/dL   RDW 20.6 (H) 11.5 - 15.5 %   Platelets 497 (H) 150 - 400 K/uL    Comment: REPEATED TO VERIFY   nRBC 0.3 (H) 0.0 - 0.2 %    Comment: Performed at [facility] [address] [place], [place] [address]  I-Stat beta hCG blood, ED     Status: None   Collection Time: 01/15/19  5:59 PM  Result Value Ref Range   I-stat hCG, quantitative <5.0 <5 mIU/mL   Comment 3            Comment:   GEST. AGE      CONC.  (mIU/mL)   <=1 WEEK        5 - 50     2 WEEKS       50 - 500     3 WEEKS       100 - 10,000     4 WEEKS     1,000 - 30,000        FEMALE AND NON-PREGNANT FEMALE:  LESS THAN 5 mIU/mL   Comprehensive metabolic panel     Status: Abnormal   Collection Time: 01/15/19  7:20 PM  Result Value Ref Range   Sodium 137 135 - 145 mmol/L   Potassium 4.0 3.5 - 5.1 mmol/L   Chloride 106 98 - 111 mmol/L   CO2 23 22 - 32 mmol/L   Glucose, Bld 92 70 - 99 mg/dL   BUN 9 6 - 20 mg/dL   Creatinine, Ser 0.77 0.44 - 1.00 mg/dL   Calcium 8.7 (L) 8.9 - 10.3 mg/dL   Total Protein 6.2 (L) 6.5 - 8.1 g/dL   Albumin 3.4 (L) 3.5 - 5.0 g/dL   AST 15 15 - 41 U/L   ALT 11 0 - 44 U/L   Alkaline Phosphatase 60 38 - 126 U/L   Total Bilirubin 0.3 0.3 - 1.2 mg/dL   GFR calc non Af Amer >60 >60 mL/min   GFR calc Af Amer >60 >60 mL/min   Anion gap 8 5 - 15    Comment: Performed at [facility] [address], [place], [place] [address]  Protime-INR     Status: None   Collection Time: 01/15/19  7:20 PM  Result Value Ref Range   Prothrombin Time 13.9 11.4 - 15.2 seconds   INR 1.1 0.8 - 1.2    Comment: (NOTE) INR goal varies based on device and disease states. Performed at [facility], [place] [address] [place],  [place] [address]   PTT     Status: None   Collection Time: 01/15/19  7:20 PM  Result Value Ref Range   aPTT 28 24 - 36 seconds    Comment: Performed at [facility] [address], [place], [place] [address]  Type and screen [place]     Status: None   Collection Time: 01/15/19  7:30 PM  Result Value Ref Range   ABO/RH(D) O POS    Antibody Screen POS    Sample Expiration      01/18/2019,2359 Performed at [facility], [place] [address] [place], [place] [address]   Prepare RBC     Status: None   Collection Time: 01/15/19  7:30 PM  Result Value Ref Range   Order Confirmation      ORDER PROCESSED BY BLOOD BANK Performed at [facility], [place] [address] [place], [place] [address]   SARS Coronavirus 2 [facility] order, Performed in [facility] hospital lab) Nasopharyngeal Nasopharyngeal Swab     Status: None   Collection Time: 01/15/19  7:30 PM   Specimen: Nasopharyngeal Swab  Result Value Ref Range   SARS Coronavirus 2 NEGATIVE NEGATIVE    Comment: (NOTE) If result is NEGATIVE SARS-CoV-2 target nucleic acids are NOT DETECTED. The SARS-CoV-2 RNA is generally detectable in upper and lower  respiratory specimens during the acute phase of infection. The lowest  concentration of SARS-CoV-2 viral copies this assay can detect is 250  copies / mL. A negative result does not preclude SARS-CoV-2 infection  and should not be used as the sole basis for treatment or other  patient management decisions.  A negative result may occur with  improper specimen collection / handling, submission of specimen other  than nasopharyngeal swab, presence of viral mutation(s) within the  areas targeted by this assay, and inadequate number of viral copies  (<250 copies / mL). A negative result must be combined with clinical  observations, patient history, and epidemiological  information. If result is POSITIVE SARS-CoV-2 target nucleic acids are DETECTED. The SARS-CoV-2 RNA is  generally detectable in upper and lower  respiratory specimens dur ing the acute phase of infection.  Positive  results are indicative of active infection with SARS-CoV-2.  Clinical  correlation with patient history and other diagnostic information is  necessary to determine patient infection status.  Positive results do  not rule out bacterial infection or co-infection with other viruses. If result is PRESUMPTIVE POSTIVE SARS-CoV-2 nucleic acids MAY BE PRESENT.   A presumptive positive result was obtained on the submitted specimen  and confirmed on repeat testing.  While 2019 novel coronavirus  (SARS-CoV-2) nucleic acids may be present in the submitted sample  additional confirmatory testing may be necessary for epidemiological  and / or clinical management purposes  to differentiate between  SARS-CoV-2 and other Sarbecovirus currently known to infect humans.  If clinically indicated additional testing with an alternate test  methodology [id number]) is advised. The SARS-CoV-2 RNA is generally  detectable in upper and lower respiratory sp ecimens during the acute  phase of infection. The expected result is Negative. Fact Sheet for Patients:  StrictlyIdeas.no Fact Sheet for Healthcare Providers: BankingDealers.co.za This test is not yet approved or cleared by the [place] FDA and has been authorized for detection and/or diagnosis of SARS-CoV-2 by FDA under an Emergency Use Authorization (EUA).  This EUA will remain in effect (meaning this test can be used) for the duration of the COVID-19 declaration under Section 564(b)(1) of the Act, 21 U.S.C. section 360bbb-3(b)(1), unless the authorization is terminated or revoked sooner. Performed at [facility], [place] [address] [place], [place] [address]   Urinalysis, Routine w reflex microscopic     Status: Abnormal   Collection Time: 01/15/19  8:20 PM  Result Value Ref Range   Color,  Urine YELLOW YELLOW   APPearance HAZY (A) CLEAR   Specific Gravity, Urine 1.018 1.005 - 1.030   pH 7.0 5.0 - 8.0   Glucose, UA NEGATIVE NEGATIVE mg/dL   Hgb urine dipstick NEGATIVE NEGATIVE   Bilirubin Urine NEGATIVE NEGATIVE   Ketones, ur NEGATIVE NEGATIVE mg/dL   Protein, ur NEGATIVE NEGATIVE mg/dL   Nitrite NEGATIVE NEGATIVE   Leukocytes,Ua NEGATIVE NEGATIVE    Comment: Performed at [facility] [address], [place], [place] [address]  Wet prep, genital     Status: None   Collection Time: 01/15/19  8:30 PM   Specimen: Cervical/Vaginal swab  Result Value Ref Range   Yeast Wet Prep HPF POC NONE SEEN NONE SEEN   Trich, Wet Prep NONE SEEN NONE SEEN   Clue Cells Wet Prep HPF POC NONE SEEN NONE SEEN   WBC, Wet Prep HPF POC NONE SEEN NONE SEEN    Comment: Swab received with less than 0.5 mL of saline, saline added to specimen, interpret results with caution.   Sperm NONE SEEN     Comment: Performed at [facility], [place] [address] [place], [place] [address]   No results found.  Pending Labs Unresulted Labs (From admission, onward)    Start     Ordered   01/15/19 1920  RPR  (STI Panel)  Once,   STAT     01/15/19 1921   01/15/19 1920  HIV antibody  (STI Panel)  Once,   STAT     01/15/19 1921   01/15/19 1920  Urine culture  (STI Panel)  Once,   STAT  01/15/19 1921          Vitals/Pain Today's Vitals   01/15/19 1735 01/15/19 1930 01/15/19 2015 01/15/19 2103  BP:  127/72 113/69 128/71  Pulse:  71 83 67  Resp:  16 16 12   Temp:      TempSrc:      SpO2:  100% 100% 100%  Weight: 83.9 kg     Height: 6\' 1"  (1.854 m)     PainSc: 10-Worst pain ever 0-No pain 0-No pain     Isolation Precautions No active isolations  Medications Medications  0.9 %  sodium chloride infusion (Manually program via Guardrails IV Fluids) (has no administration in time range)  acetaminophen (TYLENOL) tablet 650 mg (has no administration in time range)  megestrol (MEGACE) tablet  40 mg (has no administration in time range)  conjugated estrogens (PREMARIN) injection 25 mg (has no administration in time range)  prenatal multivitamin tablet 1 tablet (has no administration in time range)    Mobility walks     Focused Assessments Genitourinary   R Recommendations: See Admitting Provider Note  Report given to:   Additional Notes:  Pt last pap smear was 10 years ago; no regular gyn. Heavy menstruals.

## 2019-01-16 ENCOUNTER — Observation Stay (HOSPITAL_COMMUNITY): Payer: Medicaid Other

## 2019-01-16 ENCOUNTER — Encounter (HOSPITAL_COMMUNITY): Payer: Self-pay

## 2019-01-16 DIAGNOSIS — N83291 Other ovarian cyst, right side: Secondary | ICD-10-CM | POA: Diagnosis not present

## 2019-01-16 DIAGNOSIS — N938 Other specified abnormal uterine and vaginal bleeding: Secondary | ICD-10-CM | POA: Diagnosis not present

## 2019-01-16 LAB — URINE CULTURE: Culture: NO GROWTH

## 2019-01-16 LAB — CBC
HCT: 27 % — ABNORMAL LOW (ref 36.0–46.0)
Hemoglobin: 8.3 g/dL — ABNORMAL LOW (ref 12.0–15.0)
MCH: 21.8 pg — ABNORMAL LOW (ref 26.0–34.0)
MCHC: 30.7 g/dL (ref 30.0–36.0)
MCV: 70.9 fL — ABNORMAL LOW (ref 80.0–100.0)
Platelets: 441 10*3/uL — ABNORMAL HIGH (ref 150–400)
RBC: 3.81 MIL/uL — ABNORMAL LOW (ref 3.87–5.11)
RDW: 25.1 % — ABNORMAL HIGH (ref 11.5–15.5)
WBC: 11.9 10*3/uL — ABNORMAL HIGH (ref 4.0–10.5)
nRBC: 0.2 % (ref 0.0–0.2)

## 2019-01-16 LAB — TYPE AND SCREEN
ABO/RH(D): O POS
Antibody Screen: POSITIVE

## 2019-01-16 LAB — HIV ANTIBODY (ROUTINE TESTING W REFLEX): HIV Screen 4th Generation wRfx: NONREACTIVE

## 2019-01-16 LAB — RPR: RPR Ser Ql: NONREACTIVE

## 2019-01-16 MED ORDER — FERROUS SULFATE 325 (65 FE) MG PO TABS: 325.0000 mg | ORAL_TABLET | Freq: Every day | ORAL | 3 refills | Status: DC

## 2019-01-16 MED ORDER — MEGESTROL ACETATE 40 MG PO TABS: 40.0000 mg | ORAL_TABLET | Freq: Two times a day (BID) | ORAL | 2 refills | Status: DC

## 2019-01-16 NOTE — ED Notes (Signed)
Patient transported to Ultrasound 

## 2019-01-16 NOTE — Progress Notes (Signed)
Started 2nd unit of blood transfusion, vital signs stable, no reaction noted, will continue to monitor. Will handoff to day nurse CBC post transfusion (RN to place lab order with appropriate draw time).

## 2019-01-16 NOTE — Progress Notes (Signed)
Savannah Mayer to be D/C'd  per MD order. Discussed with the patient and all questions fully answered.  VSS, Skin clean, dry and intact without evidence of skin break down, no evidence of skin tears noted.  IV catheter discontinued intact. Site without signs and symptoms of complications. Dressing and pressure applied.  An After Visit Summary was printed and given to the patient. Patient received prescription.  D/c education completed with patient/family including follow up instructions, medication list, d/c activities limitations if indicated, with other d/c instructions as indicated by MD - patient able to verbalize understanding, all questions fully answered.   Patient instructed to return to ED, call 911, or call MD for any changes in condition.   Patient to be escorted via [place], and D/C home via private auto.

## 2019-01-16 NOTE — Progress Notes (Signed)
0602 1st unit of blood transfusion completed, pt lying comfortably in bed with vital signs stable, no reaction noted, will continue to monitor.

## 2019-01-16 NOTE — Discharge Instructions (Signed)
Abnormal Uterine Bleeding Abnormal uterine bleeding means bleeding more than usual from your uterus. It can include:  Bleeding between periods.  Bleeding after sex.  Bleeding that is heavier than normal.  Periods that last longer than usual.  Bleeding after you have stopped having your period (menopause). There are many problems that may cause this. You should see a doctor for any kind of bleeding that is not normal. Treatment depends on the cause of the bleeding. Follow these instructions at home:  Watch your condition for any changes.  Do not use tampons, douche, or have sex, if your doctor tells you not to.  Change your pads often.  Get regular well-woman exams. Make sure they include a pelvic exam and cervical cancer screening.  Keep all follow-up visits as told by your doctor. This is important. Contact a doctor if:  The bleeding lasts more than one week.  You feel dizzy at times.  You feel like you are going to throw up (nauseous).  You throw up. Get help right away if:  You pass out.  You have to change pads every hour.  You have belly (abdominal) pain.  You have a fever.  You get sweaty.  You get weak.  You passing large blood clots from your vagina. Summary  Abnormal uterine bleeding means bleeding more than usual from your uterus.  There are many problems that may cause this. You should see a doctor for any kind of bleeding that is not normal.  Treatment depends on the cause of the bleeding. This information is not intended to replace advice given to you by your health care provider. Make sure you discuss any questions you have with your health care provider. Document Released: 02/25/2009 Document Revised: 04/24/2016 Document Reviewed: 04/24/2016 Elsevier Patient Education  [place].   Dysfunctional Uterine Bleeding Dysfunctional uterine bleeding is abnormal bleeding from the uterus. Dysfunctional uterine bleeding includes:  A  menstrual period that comes earlier or later than usual.  A menstrual period that is lighter or heavier than usual, or has large blood clots.  Vaginal bleeding between menstrual periods.  Skipping one or more menstrual periods.  Vaginal bleeding after sex.  Vaginal bleeding after menopause. Follow these instructions at home: Eating and drinking   Eat well-balanced meals. Include foods that are high in iron, such as liver, meat, shellfish, green leafy vegetables, and eggs.  To prevent or treat constipation, your health care provider may recommend that you: ? Drink enough fluid to keep your urine pale yellow. ? Take over-the-counter or prescription medicines. ? Eat foods that are high in fiber, such as beans, whole grains, and fresh fruits and vegetables. ? Limit foods that are high in fat and processed sugars, such as fried or sweet foods. Medicines  Take over-the-counter and prescription medicines only as told by your health care provider.  Do not change medicines without talking with your health care provider.  Aspirin or medicines that contain aspirin may make the bleeding worse. Do not take those medicines: ? During the week before your menstrual period. ? During your menstrual period.  If you were prescribed iron pills, take them as told by your health care provider. Iron pills help to replace iron that your body loses because of this condition. Activity  If you need to change your sanitary pad or tampon more than one time every 2 hours: ? Lie in bed with your feet raised (elevated). ? Place a cold pack on your lower abdomen. ? Rest as  much as possible until the bleeding stops or slows down.  Do not try to lose weight until the bleeding has stopped and your blood iron level is back to normal. General instructions   For two months, write down: ? When your menstrual period starts. ? When your menstrual period ends. ? When any abnormal vaginal bleeding occurs. ? What  problems you notice.  Keep all follow up visits as told by your health care provider. This is important. Contact a health care provider if you:  Feel light-headed or weak.  Have nausea and vomiting.  Cannot eat or drink without vomiting.  Feel dizzy or have diarrhea while you are taking medicines.  Are taking birth control pills or hormones, and you want to change them or stop taking them. Get help right away if:  You develop a fever or chills.  You need to change your sanitary pad or tampon more than one time per hour.  Your vaginal bleeding becomes heavier, or your flow contains clots more often.  You develop pain in your abdomen.  You lose consciousness.  You develop a rash. Summary  Dysfunctional uterine bleeding is abnormal bleeding from the uterus.  It includes menstrual bleeding of abnormal duration, volume, or regularity.  Bleeding after sex and after menopause are also considered dysfunctional uterine bleeding. This information is not intended to replace advice given to you by your health care provider. Make sure you discuss any questions you have with your health care provider. Document Released: 04/27/2000 Document Revised: 10/09/2017 Document Reviewed: 10/09/2017 Elsevier Patient Education  2020 [employer].

## 2019-01-16 NOTE — Discharge Summary (Signed)
Physician Discharge Summary  Patient ID: Savannah Mayer MRN: [id number] DOB/AGE: 1981-01-12 38 y.o.  Admit date: 01/15/2019 Discharge date: 01/16/2019  Admission Diagnoses:DUB, anemia  Discharge Diagnoses:  Active Problems:   DUB (dysfunctional uterine bleeding) Anemai  Discharged Condition: fair  Hospital Course: Savannah Mayer is an 38 y.o. female [id number] LMP 12/26/18 withpast medical history significant foranemia,dysfunctional uterine bleeding,who presents for evaluation of lightheadedness. Patient states she has had her menstrual cycle x3 weeks. States she is having to change her pad approximately every 1-2 hours. Patient states she is having clots the size of quarters. Patient states her bleeding has slowed down somewhat since it began however she is still changing her pads less than every 2 hours. She has had some dizziness and gets short of breath since her vaginal bleeding began. States she does have a headache located to her posterior head. Patient states this is a similar headache that she got in November of last year when he was diagnosed with anemia. She denies sudden onset thunderclap headache, neck pain, neck stiffness, unilateral weakness, blurred vision. Patient describes the pain as dull and nagging. Denies chest pain,hemoptysis, fever, chills,abdominal pain, diarrhea dysuria. Patient states she is sexually active and intermittently uses protection. States prior to her vaginal bleeding began she did have a thick vaginal discharge. Was previously on Provera for vaginal bleeding. Not followed by PCP or OB/GYN. She has a prior history of tubal ligation. Denies additional aggravating or alleviating factors. Received 2 u PRBC's with appropriate rise in Hgb. Post transfusion Hgb 8.3.  Pertinent Gynecological History: Bleeding: dysfunctional uterine bleeding Contraception: tubal ligation Essure and PP BTL 10 years ago Blood transfusions: 2 units PRBC 03/2018 Sexually  transmitted diseases: no past history Previous GYN Procedures: Essure, BTL    Menstrual History:  Patient's last menstrual period was 01/15/2019.  Menses generally are every 30 days and last 4 days without pain  No past medical history on file.       Past Surgical History:  Procedure Laterality Date  . TUBAL LIGATION    Essure       Family History  Problem Relation Age of Onset  . Anemia Mother   . Healthy Mother     Social History:  reports that she has been smoking cigars. She has never used smokeless tobacco. She reports current alcohol use. She reports that she does not use drugs.  Allergies: No Known Allergies  (Not in a hospital admission)   ROS  Blood pressure 113/69, pulse 83, temperature 98.3 F (36.8 C), temperature source Oral, resp. rate 16, height 6\' 1"  (1.854 m), weight 83.9 kg, last menstrual period 01/15/2019, SpO2 100 %. Physical Exam  Vitals reviewed. Constitutional: She is oriented to person, place, and time. She appears well-developed. No distress.  HENT:  Head: Normocephalic.  Neck: Normal range of motion. Neck supple.  Cardiovascular: Normal rate.  Respiratory: She is in respiratory distress.  GI: Soft. She exhibits no distension and no mass. There is no abdominal tenderness.  Musculoskeletal: Normal range of motion.  Neurological: She is alert and oriented to person, place, and time.  Skin: Skin is warm and dry. There is pallor.    Lab Results Last 24 Hours        Results for orders placed or performed during the hospital encounter of 01/15/19 (from the past 24 hour(s))  CBC     Status: Abnormal   Collection Time: 01/15/19  5:45 PM  Result Value Ref Range   WBC 11.3 (H) 4.0 -  10.5 K/uL   RBC 3.20 (L) 3.87 - 5.11 MIL/uL   Hemoglobin 5.9 (LL) 12.0 - 15.0 g/dL   HCT 21.0 (L) 36.0 - 46.0 %   MCV 65.6 (L) 80.0 - 100.0 fL   MCH 18.4 (L) 26.0 - 34.0 pg   MCHC 28.1 (L) 30.0 - 36.0 g/dL   RDW 20.6 (H) 11.5 - 15.5 %    Platelets 497 (H) 150 - 400 K/uL   nRBC 0.3 (H) 0.0 - 0.2 %  I-Stat beta hCG blood, ED     Status: None   Collection Time: 01/15/19  5:59 PM  Result Value Ref Range   I-stat hCG, quantitative <5.0 <5 mIU/mL   Comment 3          Comprehensive metabolic panel     Status: Abnormal   Collection Time: 01/15/19  7:20 PM  Result Value Ref Range   Sodium 137 135 - 145 mmol/L   Potassium 4.0 3.5 - 5.1 mmol/L   Chloride 106 98 - 111 mmol/L   CO2 23 22 - 32 mmol/L   Glucose, Bld 92 70 - 99 mg/dL   BUN 9 6 - 20 mg/dL   Creatinine, Ser 0.77 0.44 - 1.00 mg/dL   Calcium 8.7 (L) 8.9 - 10.3 mg/dL   Total Protein 6.2 (L) 6.5 - 8.1 g/dL   Albumin 3.4 (L) 3.5 - 5.0 g/dL   AST 15 15 - 41 U/L   ALT 11 0 - 44 U/L   Alkaline Phosphatase 60 38 - 126 U/L   Total Bilirubin 0.3 0.3 - 1.2 mg/dL   GFR calc non Af Amer >60 >60 mL/min   GFR calc Af Amer >60 >60 mL/min   Anion gap 8 5 - 15  Protime-INR     Status: None   Collection Time: 01/15/19  7:20 PM  Result Value Ref Range   Prothrombin Time 13.9 11.4 - 15.2 seconds   INR 1.1 0.8 - 1.2  PTT     Status: None   Collection Time: 01/15/19  7:20 PM  Result Value Ref Range   aPTT 28 24 - 36 seconds  Type and screen [place]     Status: None   Collection Time: 01/15/19  7:30 PM  Result Value Ref Range   ABO/RH(D) O POS    Antibody Screen POS    Sample Expiration      01/18/2019,2359 Performed at [facility], [address] [place], [place] [address]   Prepare RBC     Status: None   Collection Time: 01/15/19  7:30 PM  Result Value Ref Range   Order Confirmation      ORDER PROCESSED BY BLOOD BANK Performed at [facility], [place] [address], [place], [place] [address]   SARS Coronavirus 2 [facility] order, Performed in [facility] hospital lab) Nasopharyngeal Nasopharyngeal Swab     Status: None   Collection Time: 01/15/19  7:30 PM   Specimen: Nasopharyngeal Swab   Result Value Ref Range   SARS Coronavirus 2 NEGATIVE NEGATIVE  Urinalysis, Routine w reflex microscopic     Status: Abnormal   Collection Time: 01/15/19  8:20 PM  Result Value Ref Range   Color, Urine YELLOW YELLOW   APPearance HAZY (A) CLEAR   Specific Gravity, Urine 1.018 1.005 - 1.030   pH 7.0 5.0 - 8.0   Glucose, UA NEGATIVE NEGATIVE mg/dL   Hgb urine dipstick NEGATIVE NEGATIVE   Bilirubin Urine NEGATIVE NEGATIVE  Ketones, ur NEGATIVE NEGATIVE mg/dL   Protein, ur NEGATIVE NEGATIVE mg/dL   Nitrite NEGATIVE NEGATIVE   Leukocytes,Ua NEGATIVE NEGATIVE      Imaging Results (Last 48 hours)  No results found.    Assessment/Plan: DUB with anemia requiring transfusion. 2 units PRBC was ordered and she will receive IV Premarin and oral Megace to control her current bleeding. Pelvic US ordered for tomorrow morning.  Emeterio Reeve 01/15/2019, 9:00 PM She received 2 units of PRBC and felt better afterward. He bleeding improved during her stay  Consults: None  Significant Diagnostic Studies: labs:  CBC    Component Value Date/Time   WBC 11.9 (H) 01/16/2019 1327   RBC 3.81 (L) 01/16/2019 1327   HGB 8.3 (L) 01/16/2019 1327   HCT 27.0 (L) 01/16/2019 1327   PLT 441 (H) 01/16/2019 1327   MCV 70.9 (L) 01/16/2019 1327   MCH 21.8 (L) 01/16/2019 1327   MCHC 30.7 01/16/2019 1327   RDW 25.1 (H) 01/16/2019 1327   LYMPHSABS 2.5 04/10/2018 0146   MONOABS 1.2 (H) 04/10/2018 0146   EOSABS 0.1 04/10/2018 0146   BASOSABS 0.0 04/10/2018 0146    and radiology: Ultrasound: pelvic CLINICAL DATA:  Initial evaluation for dysfunctional uterine bleeding for 3 weeks.  EXAM: TRANSABDOMINAL AND TRANSVAGINAL ULTRASOUND OF PELVIS  TECHNIQUE: Both transabdominal and transvaginal ultrasound examinations of the pelvis were performed. Transabdominal technique was performed for global imaging of the pelvis including uterus, ovaries, adnexal regions, and pelvic cul-de-sac. It was  necessary to proceed with endovaginal exam following the transabdominal exam to visualize the uterus, endometrium, and ovaries.  COMPARISON:  None  FINDINGS: Uterus  Measurements: 9.8 x 5.5 x 6.0 cm = volume: 169.3 mL. No fibroids or other mass visualized.  Endometrium  Thickness: 11.0 mm.  No focal abnormality visualized.  Right ovary  Measurements: 2.6 x 1.7 x 1.2 cm = volume: 2.9 mL. Normal appearance/no adnexal mass.  Left ovary  Measurements: 4.6 x 3.6 x 3.9 cm = volume: 3.4 mL. 4.1 cm simple cyst with internal daughter cyst, most consistent with a normal physiologic follicular cyst.  Other findings  No abnormal free fluid.  IMPRESSION: 1. Endometrial stripe measures 11 mm in thickness. If bleeding remains unresponsive to hormonal or medical therapy, sonohysterogram should be considered for focal lesion work-up. (Ref: Radiological Reasoning: Algorithmic Workup of Abnormal Vaginal Bleeding with Endovaginal Sonography and Sonohysterography. [id number] [id number]). 2. 4.1 cm simple left ovarian cyst, most consistent with a normal physiologic follicular cyst. 3. Otherwise unremarkable and normal pelvic ultrasound.   Electronically Signed   By: Jeannine Boga M.D.   On: 01/16/2019 00:40  Treatments: IV hydration and transfusion, oral Megace and IV Premarin  Discharge Exam: Blood pressure 110/66, pulse 67, temperature 97.9 F (36.6 C), temperature source Oral, resp. rate 18, height 6\' 1"  (1.854 m), weight 83.9 kg, last menstrual period 01/15/2019, SpO2 100 %. Physical Exam Vitals signs and nursing note reviewed.  Constitutional:      Appearance: Normal appearance.  Cardiovascular:     Rate and Rhythm: Normal rate.  Pulmonary:     Effort: Pulmonary effort is normal.  Abdominal:     General: Abdomen is flat.     Palpations: Abdomen is soft.  Musculoskeletal: Normal range of motion.  Skin:    General: Skin is warm.  Neurological:      Mental Status: She is alert.  Psychiatric:        Mood and Affect: Mood normal.      Disposition: Discharge  disposition: 01-Home or Self Care       Discharge Instructions    Call MD for:  difficulty breathing, headache or visual disturbances   Complete by: As directed    Call MD for:  extreme fatigue   Complete by: As directed    Call MD for:  persistant dizziness or light-headedness   Complete by: As directed    Diet - low sodium heart healthy   Complete by: As directed    Increase activity slowly   Complete by: As directed      Allergies as of 01/16/2019   No Known Allergies     Medication List    TAKE these medications   acetaminophen 325 MG suppository Commonly known as: TYLENOL Place 325 mg rectally every 4 (four) hours as needed (for headache).   BIOTIN PO Take 1 tablet by mouth daily.   ferrous sulfate 325 (65 FE) MG tablet Take 1 tablet (325 mg total) by mouth daily.   ibuprofen 200 MG tablet Commonly known as: ADVIL Take 200 mg by mouth every 6 (six) hours as needed for headache.   megestrol 40 MG tablet Commonly known as: MEGACE Take 1 tablet (40 mg total) by mouth 2 (two) times daily.   naproxen sodium 220 MG tablet Commonly known as: ALEVE Take 220 mg by mouth 2 (two) times daily as needed (for headache).      Follow-up Information    [facility] Follow up in 3 week(s).   Specialty: Obstetrics and Gynecology Why: Dr Shary Decamp information: [address], [address] [place] [phone]          Signed: Donnamae Jude 01/16/2019, 2:11 PM

## 2019-01-16 NOTE — Progress Notes (Signed)
Received pt from ED, alert oriented x4 with VSS and skin intact. Oriented to room, bed controls and plan of care. Left lying comfortably in bed with call bell at reach. Will continue to monitor.   Still waiting for blood bank for transfusion.

## 2019-01-16 NOTE — Plan of Care (Signed)

## 2019-01-17 LAB — BPAM RBC
Blood Product Expiration Date: 202010052359
Blood Product Expiration Date: 202010052359
ISSUE DATE / TIME: 202009040203
ISSUE DATE / TIME: 202009040543
Unit Type and Rh: 5100
Unit Type and Rh: 5100

## 2019-01-17 LAB — TYPE AND SCREEN
ABO/RH(D): O POS
Antibody Screen: POSITIVE
Unit division: 0
Unit division: 0

## 2019-01-17 LAB — GC/CHLAMYDIA PROBE AMP (~~LOC~~) NOT AT ARMC
Chlamydia: NEGATIVE
Neisseria Gonorrhea: NEGATIVE

## 2019-02-18 ENCOUNTER — Encounter: Payer: Self-pay | Admitting: Obstetrics & Gynecology

## 2019-02-18 ENCOUNTER — Ambulatory Visit: Payer: Medicaid Other | Admitting: Obstetrics & Gynecology

## 2019-02-18 ENCOUNTER — Other Ambulatory Visit (HOSPITAL_COMMUNITY)
Admission: RE | Admit: 2019-02-18 | Discharge: 2019-02-18 | Disposition: A | Discharge status: Home / Self Care | Payer: Medicaid Other | Source: Ambulatory Visit | Attending: Obstetrics & Gynecology | Admitting: Obstetrics & Gynecology

## 2019-02-18 ENCOUNTER — Other Ambulatory Visit: Payer: Self-pay

## 2019-02-18 VITALS — BP 129/82 | HR 83 | Ht 73.0 in | Wt 196.1 lb

## 2019-02-18 DIAGNOSIS — Z0000 Encounter for general adult medical examination without abnormal findings: Secondary | ICD-10-CM | POA: Diagnosis not present

## 2019-02-18 DIAGNOSIS — D500 Iron deficiency anemia secondary to blood loss (chronic): Secondary | ICD-10-CM | POA: Diagnosis not present

## 2019-02-18 DIAGNOSIS — N939 Abnormal uterine and vaginal bleeding, unspecified: Secondary | ICD-10-CM | POA: Diagnosis not present

## 2019-02-18 DIAGNOSIS — Z01419 Encounter for gynecological examination (general) (routine) without abnormal findings: Secondary | ICD-10-CM

## 2019-02-18 MED ORDER — MEGESTROL ACETATE 40 MG PO TABS: 40.0000 mg | ORAL_TABLET | Freq: Two times a day (BID) | ORAL | 5 refills | Status: DC

## 2019-02-18 NOTE — Patient Instructions (Signed)
Vaginal Hysterectomy  A vaginal hysterectomy is a procedure to remove all or part of the uterus through a small incision in the vagina. In this procedure, your health care provider may remove your entire uterus, including the lower end (cervix). You may need a vaginal hysterectomy to treat:  Uterine fibroids.  A condition that causes the lining of the uterus to grow in other areas (endometriosis).  Problems with pelvic support.  Cancer of the cervix, ovaries, uterus, or tissue that lines the uterus (endometrium).  Excessive (dysfunctional) uterine bleeding. When removing your uterus, your health care provider may also remove the organs that produce eggs (ovaries) and the tubes that carry eggs to your uterus (fallopian tubes). After a vaginal hysterectomy, you will no longer be able to have a baby. You will also no longer get your menstrual period. Tell a health care provider about:  Any allergies you have.  All medicines you are taking, including vitamins, herbs, eye drops, creams, and over-the-counter medicines.  Any problems you or family members have had with anesthetic medicines.  Any blood disorders you have.  Any surgeries you have had.  Any medical conditions you have.  Whether you are pregnant or may be pregnant. What are the risks? Generally, this is a safe procedure. However, problems may occur, including:  Bleeding.  Infection.  A blood clot that forms in your leg and travels to your lungs (pulmonary embolism).  Damage to surrounding organs.  Pain during sex. What happens before the procedure?  Ask your health care provider what organs will be removed during surgery.  Ask your health care provider about: ? Changing or stopping your regular medicines. This is especially important if you are taking diabetes medicines or blood thinners. ? Taking medicines such as aspirin and ibuprofen. These medicines can thin your blood. Do not take these medicines before your  procedure if your health care provider instructs you not to.  Follow instructions from your health care provider about eating or drinking restrictions.  Do not use any tobacco products, such as cigarettes, chewing tobacco, and e-cigarettes. If you need help quitting, ask your health care provider.  Plan to have someone take you home after discharge from the hospital. What happens during the procedure?  To reduce your risk of infection: ? Your health care team will wash or sanitize their hands. ? Your skin will be washed with soap.  An IV tube will be inserted into one of your veins.  You may be given antibiotic medicine to help prevent infection.  You will be given one or more of the following: ? A medicine to help you relax (sedative). ? A medicine to numb the area (local anesthetic). ? A medicine to make you fall asleep (general anesthetic). ? A medicine that is injected into an area of your body to numb everything beyond the injection site (regional anesthetic).  Your surgeon will make an incision in your vagina.  Your surgeon will locate and remove all or part of your uterus.  Your ovaries and fallopian tubes may be removed at the same time.  The incision will be closed with stitches (sutures) that dissolve over time. The procedure may vary among health care providers and hospitals. What happens after the procedure?  Your blood pressure, heart rate, breathing rate, and blood oxygen level will be monitored often until the medicines you were given have worn off.  You will be encouraged to get up and walk around after a few hours to help prevent complications.    You may have IV tubes in place for a few days.  You will be given pain medicine as needed.  Do not drive for 24 hours if you were given a sedative. This information is not intended to replace advice given to you by your health care provider. Make sure you discuss any questions you have with your health care  provider. Document Released: 08/22/2015 Document Revised: 12/22/2015 Document Reviewed: 05/15/2015 Elsevier Patient Education  2020 [employer]. Hysterectomy Information  A hysterectomy is a surgery in which the uterus is removed. The fallopian tubes and ovaries may be removed (bilateral salpingo-oophorectomy) as well. This procedure may be done to treat various medical problems. After the procedure, a woman will no longer have menstrual periods nor will she be able to become pregnant (sterile). What are the reasons for a hysterectomy? There are many reasons why a woman might have this procedure. They include:  Persistent, abnormal vaginal bleeding.  Long-term (chronic) pelvic pain or infection.  Endometriosis. This is when the lining of the uterus (endometrium) starts to grow outside the uterus.  Adenomyosis. This is when the endometrium starts to grow in the muscle of the uterus.  Pelvic organ prolapse. This is a condition in which the uterus falls down into the vagina.  Noncancerous growths in the uterus (uterine fibroids) that cause symptoms.  The presence of precancerous cells.  Cervical or uterine cancer. What are the different types of hysterectomy? There are three different types of hysterectomy:  Supracervical hysterectomy. In this type, the top part of the uterus is removed, but not the cervix.  Total hysterectomy. In this type, the uterus and cervix are removed.  Radical hysterectomy. In this type, the uterus, the cervix, and the tissue that holds the uterus in place (parametrium) are removed. What are the different ways a hysterectomy can be performed? There are many different ways a hysterectomy can be performed, including:  Abdominal hysterectomy. In this type, an incision is made in the abdomen. The uterus is removed through this incision.  Vaginal hysterectomy. In this type, an incision is made in the vagina. The uterus is removed through this incision. There  are no abdominal incisions.  Conventional laparoscopic hysterectomy. In this type, three or four small incisions are made in the abdomen. A thin, lighted tube with a camera (laparoscope) is inserted into one of the incisions. Other tools are put through the other incisions. The uterus is cut into small pieces. The small pieces are removed through the incisions or through the vagina.  Laparoscopically assisted vaginal hysterectomy (LAVH). In this type, three or four small incisions are made in the abdomen. Part of the surgery is performed laparoscopically and the other part is done vaginally. The uterus is removed through the vagina.  Robot-assisted laparoscopic hysterectomy. In this type, a laparoscope and other tools are inserted into three or four small incisions in the abdomen. A computer-controlled device is used to give the surgeon a 3D image and to help control the surgical instruments. This allows for more precise movements of surgical instruments. The uterus is cut into small pieces and removed through the incisions or removed through the vagina. Discuss the options with your health care provider to determine which type is the right one for you. What are the risks? Generally, this is a safe procedure. However, problems may occur, including:  Bleeding and risk of blood transfusion. Tell your health care provider if you do not want to receive any blood products.  Blood clots in the legs  or lung.  Infection.  Damage to other structures or organs.  Allergic reactions to medicines.  Changing to an abdominal hysterectomy from one of the other techniques. What to expect after a hysterectomy  You will be given pain medicine.  You may need to stay in the hospital for 1- 2 days to recover, depending on the type of hysterectomy you had.  Follow your health care provider's instructions about exercise, driving, and general activities. Ask your health care provider what activities are safe for  you.  You will need to have someone with you for the first 3-5 days after you go home.  You will need to follow up with your surgeon in 2-4 weeks after surgery to evaluate your progress.  If the ovaries are removed, you will have early menopause symptoms such as hot flashes, night sweats, and insomnia.  If you had a hysterectomy for a problem that was not cancer or not a condition that could lead to cancer, then you no longer need Pap tests. However, even if you no longer need a Pap test, a regular pelvic exam is a good idea to make sure no other problems are developing. Questions to ask your health care provider  Is a hysterectomy medically necessary? Do I have other treatment options for my condition?  What are my options for hysterectomy procedure?  What organs and tissues need to be removed?  What are the risks?  What are the benefits?  How long will I need to stay in the hospital after the procedure?  How long will I need to recover at home?  What symptoms can I expect after the procedure? Summary  A hysterectomy is a surgery in which the uterus is removed. The fallopian tubes and ovaries may be removed (bilateral salpingo-oophorectomy) as well.  This procedure may be done to treat various medical problems. After the procedure, a woman will no longer have menstrual periods nor will she be able to become pregnant.  Discuss the options with your health care provider to determine which type of hysterectomy is the right one for you. This information is not intended to replace advice given to you by your health care provider. Make sure you discuss any questions you have with your health care provider. Document Released: 10/24/2000 Document Revised: 04/12/2017 Document Reviewed: 06/06/2016 Elsevier Patient Education  2020 [employer].

## 2019-02-18 NOTE — Progress Notes (Addendum)
Subjective:     Savannah Mayer is a 38 y.o. female here for a routine exam.  [id number] with LMP early Sept. Pt reports AUB. Has had blood transfusion on 2 occasions. Pt has tried to use conservative measures to no avial and wants definitive management.     Gynecologic History Patient's last menstrual period was 12/24/2018 (exact date). Contraception: tubal ligation Last Pap: >5 years prev Last mammogram: n/a  Obstetric History OB History  Gravida Para Term Preterm AB Living  6 5 5  0 1 5  SAB TAB Ectopic Multiple Live Births  1 0 0 0 5    # Outcome Date GA Lbr Len/2nd Weight Sex Delivery Anes PTL Lv  6 SAB           5 Term      Vag-Spont     4 Term      Vag-Spont     3 Term      Vag-Spont     2 Term      Vag-Spont     1 Term      Vag-Spont      The following portions of the patient's history were reviewed and updated as appropriate: allergies, current medications, past family history, past medical history, past social history, past surgical history and problem list.  Review of Systems Pertinent items are noted in HPI.    Objective:  BP 129/82   Pulse 83   Ht 6\' 1"  (1.854 m)   Wt 196 lb 1.6 oz (89 kg)   LMP 12/24/2018 (Exact Date)   BMI 25.87 kg/m   General Appearance:    Alert, cooperative, no distress, appears stated age  Head:    Normocephalic, without obvious abnormality, atraumatic  Eyes:    conjunctiva/corneas clear, EOM's intact, both eyes  Ears:    Normal external ear canals, both ears  Nose:   Nares normal, septum midline, mucosa normal, no drainage    or sinus tenderness  Throat:   Lips, mucosa, and tongue normal; teeth and gums normal  Neck:   Supple, symmetrical, trachea midline, no adenopathy;    thyroid:  no enlargement/tenderness/nodules  Back:     Symmetric, no curvature, ROM normal, no CVA tenderness  Lungs:     respirations unlabored  Chest Wall:    No tenderness or deformity   Heart:    Regular rate and rhythm  Breast Exam:    No tenderness, masses,  or nipple abnormality  Abdomen:     Soft, non-tender, bowel sounds active all four quadrants,    no masses, no organomegaly  Genitalia:    Normal female without lesion, discharge or tenderness   Uterus: mobile with good descensus. 10 weeks sized.   Extremities:   Extremities normal, atraumatic, no cyanosis or edema  Pulses:   2+ and symmetric all extremities  Skin:   Skin color, texture, turgor normal, no rashes or lesions      01/16/2019 CLINICAL DATA:  Initial evaluation for dysfunctional uterine bleeding for 3 weeks.  EXAM: TRANSABDOMINAL AND TRANSVAGINAL ULTRASOUND OF PELVIS  TECHNIQUE: Both transabdominal and transvaginal ultrasound examinations of the pelvis were performed. Transabdominal technique was performed for global imaging of the pelvis including uterus, ovaries, adnexal regions, and pelvic cul-de-sac. It was necessary to proceed with endovaginal exam following the transabdominal exam to visualize the uterus, endometrium, and ovaries.  COMPARISON:  None  FINDINGS: Uterus  Measurements: 9.8 x 5.5 x 6.0 cm = volume: 169.3 mL. No fibroids or other mass visualized.  Endometrium  Thickness: 11.0 mm.  No focal abnormality visualized.  Right ovary  Measurements: 2.6 x 1.7 x 1.2 cm = volume: 2.9 mL. Normal appearance/no adnexal mass.  Left ovary  Measurements: 4.6 x 3.6 x 3.9 cm = volume: 3.4 mL. 4.1 cm simple cyst with internal daughter cyst, most consistent with a normal physiologic follicular cyst.  Other findings  No abnormal free fluid.  IMPRESSION: 1. Endometrial stripe measures 11 mm in thickness. If bleeding remains unresponsive to hormonal or medical therapy, sonohysterogram should be considered for focal lesion work-up. (Ref: Radiological Reasoning: Algorithmic Workup of Abnormal Vaginal Bleeding with Endovaginal Sonography and Sonohysterography. AJR 2008GA:[id number]). 2. 4.1 cm simple left ovarian cyst, most consistent with a  normal physiologic follicular cyst. 3. Otherwise unremarkable and normal pelvic ultrasound. Assessment:    Healthy female exam.   AUB- pt desires definitive management. I have reviewed her options incliuding LnIUD, cont'd oral meds, endometrial ablation and hysterectomy. Pt opts for definitive management with hyst.   Plan:   F/u PAP with hrHPV STI screen including serum TSH and CBC today Keep Megace 40mg  bid until surgery   Patient desires surgical management with TVH with bilateral salpingectomy. The risks of surgery were discussed in detail with the patient including but not limited to: bleeding which may require transfusion or reoperation; infection which may require prolonged hospitalization or re-hospitalization and antibiotic therapy; injury to bowel, bladder, ureters and major vessels or other surrounding organs; need for additional procedures including laparotomy; thromboembolic phenomenon, incisional problems and other postoperative or anesthesia complications.  Patient was told that the likelihood that her condition and symptoms will be treated effectively with this surgical management was very high; the postoperative expectations were also discussed in detail. The patient also understands the alternative treatment options which were discussed in full. All questions were answered.  She was told that she will be contacted by our surgical scheduler regarding the time and date of her surgery; routine preoperative instructions of having nothing to eat or drink after midnight on the day prior to surgery and also coming to the hospital 1 1/2 hours prior to her time of surgery were also emphasized.  She was told she may be called for a preoperative appointment about a week prior to surgery and will be given further preoperative instructions at that visit. Printed patient education handouts about the procedure were given to the patient to review at home.  Pt needs a virtual preop visit prior to  surgery.   Kayliee Atienza L. Harraway-Smith, M.D., [facility] June

## 2019-02-18 NOTE — Progress Notes (Signed)
Pt is here for menorrhagia, pt reports that this has been happening for about a year. Pt was discharged from the hospital on 01/16/2019 after getting blood transfusion. Pt is now taking Megace daily and reports that she has had no further bleeding. Pt reports last pap smear was about 10 years ago. Pt requests STD testing today.

## 2019-02-19 LAB — CBC
Hematocrit: 38.5 % (ref 34.0–46.6)
Hemoglobin: 11.8 g/dL (ref 11.1–15.9)
MCH: 23.6 pg — ABNORMAL LOW (ref 26.6–33.0)
MCHC: 30.6 g/dL — ABNORMAL LOW (ref 31.5–35.7)
MCV: 77 fL — ABNORMAL LOW (ref 79–97)
Platelets: 430 10*3/uL (ref 150–450)
RBC: 5 x10E6/uL (ref 3.77–5.28)
RDW: 26.8 % — ABNORMAL HIGH (ref 11.7–15.4)
WBC: 12 10*3/uL — ABNORMAL HIGH (ref 3.4–10.8)

## 2019-02-19 LAB — HEPATITIS B SURFACE ANTIGEN: Hepatitis B Surface Ag: NEGATIVE

## 2019-02-19 LAB — HIV ANTIBODY (ROUTINE TESTING W REFLEX): HIV Screen 4th Generation wRfx: NONREACTIVE

## 2019-02-19 LAB — HEPATITIS C ANTIBODY: Hep C Virus Ab: <0.1 s/co ratio (ref 0.0–0.9)

## 2019-02-19 LAB — TSH: TSH: 1.46 u[IU]/mL (ref 0.450–4.500)

## 2019-02-19 LAB — RPR: RPR Ser Ql: NONREACTIVE

## 2019-02-26 LAB — CERVICOVAGINAL ANCILLARY ONLY
Bacterial Vaginitis (gardnerella): POSITIVE — AB
Candida Glabrata: NEGATIVE
Candida Vaginitis: NEGATIVE
Chlamydia: NEGATIVE
Comment: NEGATIVE
Comment: NEGATIVE
Comment: NEGATIVE
Comment: NEGATIVE
Comment: NEGATIVE
Comment: NORMAL
Neisseria Gonorrhea: NEGATIVE
Trichomonas: NEGATIVE

## 2019-02-26 LAB — CYTOLOGY - PAP
Comment: NEGATIVE
Diagnosis: NEGATIVE
High risk HPV: NEGATIVE

## 2019-02-27 ENCOUNTER — Other Ambulatory Visit: Payer: Self-pay | Admitting: Obstetrics & Gynecology

## 2019-02-27 ENCOUNTER — Telehealth: Payer: Self-pay

## 2019-02-27 DIAGNOSIS — B9689 Other specified bacterial agents as the cause of diseases classified elsewhere: Secondary | ICD-10-CM

## 2019-02-27 DIAGNOSIS — N760 Acute vaginitis: Secondary | ICD-10-CM

## 2019-02-27 MED ORDER — METRONIDAZOLE 500 MG PO TABS: 500.0000 mg | ORAL_TABLET | Freq: Two times a day (BID) | ORAL | 0 refills | Status: DC

## 2019-02-27 NOTE — Telephone Encounter (Signed)
Called pt regarding positive BV results. Pt made aware that she has BV. Flagyl was sent to the pharmacy. Understanding was voiced. Diamantina Edinger l Moxie Kalil, CMA

## 2019-04-08 ENCOUNTER — Encounter: Payer: Self-pay | Admitting: Obstetrics & Gynecology

## 2019-04-08 ENCOUNTER — Telehealth (INDEPENDENT_AMBULATORY_CARE_PROVIDER_SITE_OTHER): Payer: Medicaid Other | Admitting: Obstetrics & Gynecology

## 2019-04-08 DIAGNOSIS — N938 Other specified abnormal uterine and vaginal bleeding: Secondary | ICD-10-CM | POA: Diagnosis not present

## 2019-04-08 NOTE — Progress Notes (Signed)
Pt is on the phone preparing for a virtual visit with provider. Pt is having a pre op appointment for [facility] with bilateral salpingectomy. Pt reports no more bleeding while taking the Megace BID.

## 2019-04-08 NOTE — Progress Notes (Signed)
TELEHEALTH GYNECOLOGY VIRTUAL VIDEO VISIT ENCOUNTER NOTE  Provider location: [facility] for [facility] at [place]   I connected with Savannah Mayer on 04/08/19 at  8:30 AM EST by MyChart Video Encounter at home and verified that I am speaking with the correct person using two identifiers.   I discussed the limitations, risks, security and privacy concerns of performing an evaluation and management service virtually and the availability of in person appointments. I also discussed with the patient that there may be a patient responsible charge related to this service. The patient expressed understanding and agreed to proceed.   History:  Savannah Mayer is a 38 y.o. [phone] female being evaluated today for AUB. Pt is on Megace now for AUB. The bleeding is improved on Megace. She still desires definitive management. She denies any abnormal vaginal discharge, bleeding, pelvic pain or other concerns.       No past medical history on file. Past Surgical History:  Procedure Laterality Date   TUBAL LIGATION     The following portions of the patient's history were reviewed and updated as appropriate: allergies, current medications, past family history, past medical history, past social history, past surgical history and problem list.   Health Maintenance:  Normal pap and negative HRHPV on 02/18/2019.   Review of Systems:  Pertinent items noted in HPI and remainder of comprehensive ROS otherwise negative.  Physical Exam:   General:  Alert, oriented and cooperative. Patient appears to be in no acute distress.  Mental Status: Normal mood and affect. Normal behavior. Normal judgment and thought content.   Respiratory: Normal respiratory effort, no problems with respiration noted  Rest of physical exam deferred due to type of encounter  Labs and Imaging 01/16/2019 CLINICAL DATA:  Initial evaluation for dysfunctional uterine bleeding for 3 weeks.  EXAM: TRANSABDOMINAL AND TRANSVAGINAL  ULTRASOUND OF PELVIS  TECHNIQUE: Both transabdominal and transvaginal ultrasound examinations of the pelvis were performed. Transabdominal technique was performed for global imaging of the pelvis including uterus, ovaries, adnexal regions, and pelvic cul-de-sac. It was necessary to proceed with endovaginal exam following the transabdominal exam to visualize the uterus, endometrium, and ovaries.  COMPARISON:  None  FINDINGS: Uterus  Measurements: 9.8 x 5.5 x 6.0 cm = volume: 169.3 mL. No fibroids or other mass visualized.  Endometrium  Thickness: 11.0 mm.  No focal abnormality visualized.  Right ovary  Measurements: 2.6 x 1.7 x 1.2 cm = volume: 2.9 mL. Normal appearance/no adnexal mass.  Left ovary  Measurements: 4.6 x 3.6 x 3.9 cm = volume: 3.4 mL. 4.1 cm simple cyst with internal daughter cyst, most consistent with a normal physiologic follicular cyst.  Other findings  No abnormal free fluid.  IMPRESSION: 1. Endometrial stripe measures 11 mm in thickness. If bleeding remains unresponsive to hormonal or medical therapy, sonohysterogram should be considered for focal lesion work-up. (Ref: Radiological Reasoning: Algorithmic Workup of Abnormal Vaginal Bleeding with Endovaginal Sonography and Sonohysterography. AJR [id number]). 2. 4.1 cm simple left ovarian cyst, most consistent with a normal physiologic follicular cyst. 3. Otherwise unremarkable and normal pelvic ultrasound.   Assessment and Plan:     1. DUB (dysfunctional uterine bleeding) Patient desires surgical management with TVH with bilateral salpingectomy.  The risks of surgery were discussed in detail with the patient including but not limited to: bleeding which may require transfusion or reoperation; infection which may require prolonged hospitalization or re-hospitalization and antibiotic therapy; injury to bowel, bladder, ureters and major vessels or other surrounding organs; need  for  additional procedures including laparotomy; thromboembolic phenomenon, incisional problems and other postoperative or anesthesia complications.  Patient was told that the likelihood that her condition and symptoms will be treated effectively with this surgical management was very high; the postoperative expectations were also discussed in detail. The patient also understands the alternative treatment options which were discussed in full. All questions were answered.  She was told that she will be contacted by our surgical scheduler regarding the time and date of her surgery; routine preoperative instructions of having nothing to eat or drink after midnight on the day prior to surgery and also coming to the hospital 1 1/2 hours prior to her time of surgery were also emphasized.  She was told she may be called for a preoperative appointment about a week prior to surgery and will be given further preoperative instructions at that visit. Printed patient education handouts about the procedure were given to the patient to review at home.  Keep megace bid        I discussed the assessment and treatment plan with the patient. The patient was provided an opportunity to ask questions and all were answered. The patient agreed with the plan and demonstrated an understanding of the instructions.   The patient was advised to call back or seek an in-person evaluation/go to the ED if the symptoms worsen or if the condition fails to improve as anticipated.  I provided 15 minutes of face-to-face time during this encounter.   Lavonia Drafts, MD [facility], [place]

## 2019-04-23 ENCOUNTER — Encounter (HOSPITAL_BASED_OUTPATIENT_CLINIC_OR_DEPARTMENT_OTHER): Payer: Self-pay | Admitting: Obstetrics & Gynecology

## 2019-04-23 ENCOUNTER — Other Ambulatory Visit: Payer: Self-pay

## 2019-04-27 ENCOUNTER — Other Ambulatory Visit (HOSPITAL_COMMUNITY)
Admission: RE | Admit: 2019-04-27 | Discharge: 2019-04-27 | Disposition: A | Discharge status: Home / Self Care | Payer: Medicaid Other | Source: Ambulatory Visit | Attending: Obstetrics & Gynecology | Admitting: Obstetrics & Gynecology

## 2019-04-27 DIAGNOSIS — Z20828 Contact with and (suspected) exposure to other viral communicable diseases: Secondary | ICD-10-CM | POA: Diagnosis not present

## 2019-04-27 DIAGNOSIS — Z01812 Encounter for preprocedural laboratory examination: Secondary | ICD-10-CM | POA: Insufficient documentation

## 2019-04-28 LAB — NOVEL CORONAVIRUS, NAA (HOSP ORDER, SEND-OUT TO REF LAB; TAT 18-24 HRS): SARS-CoV-2, NAA: NOT DETECTED

## 2019-04-29 ENCOUNTER — Other Ambulatory Visit: Payer: Self-pay

## 2019-04-29 ENCOUNTER — Encounter (HOSPITAL_BASED_OUTPATIENT_CLINIC_OR_DEPARTMENT_OTHER)
Admission: RE | Admit: 2019-04-29 | Discharge: 2019-04-29 | Disposition: A | Discharge status: Home / Self Care | Payer: Medicaid Other | Source: Ambulatory Visit | Attending: Obstetrics & Gynecology | Admitting: Obstetrics & Gynecology

## 2019-04-29 DIAGNOSIS — N938 Other specified abnormal uterine and vaginal bleeding: Secondary | ICD-10-CM | POA: Diagnosis present

## 2019-04-29 DIAGNOSIS — Z01812 Encounter for preprocedural laboratory examination: Secondary | ICD-10-CM | POA: Diagnosis not present

## 2019-04-29 DIAGNOSIS — D259 Leiomyoma of uterus, unspecified: Secondary | ICD-10-CM | POA: Diagnosis not present

## 2019-04-29 DIAGNOSIS — N838 Other noninflammatory disorders of ovary, fallopian tube and broad ligament: Secondary | ICD-10-CM | POA: Diagnosis not present

## 2019-04-29 DIAGNOSIS — F17290 Nicotine dependence, other tobacco product, uncomplicated: Secondary | ICD-10-CM | POA: Diagnosis not present

## 2019-04-29 LAB — CBC
HCT: 41.5 % (ref 36.0–46.0)
Hemoglobin: 13.6 g/dL (ref 12.0–15.0)
MCH: 27.5 pg (ref 26.0–34.0)
MCHC: 32.8 g/dL (ref 30.0–36.0)
MCV: 83.8 fL (ref 80.0–100.0)
Platelets: 372 10*3/uL (ref 150–400)
RBC: 4.95 MIL/uL (ref 3.87–5.11)
RDW: 15.9 % — ABNORMAL HIGH (ref 11.5–15.5)
WBC: 13 10*3/uL — ABNORMAL HIGH (ref 4.0–10.5)
nRBC: 0 % (ref 0.0–0.2)

## 2019-04-29 LAB — POCT PREGNANCY, URINE: Preg Test, Ur: NEGATIVE

## 2019-04-29 NOTE — Progress Notes (Signed)
Reminded pt to come for labwork, she states she will come today.

## 2019-04-30 ENCOUNTER — Ambulatory Visit (HOSPITAL_COMMUNITY)
Admission: RE | Admit: 2019-04-30 | Discharge: 2019-04-30 | Disposition: A | Discharge status: Home / Self Care | Payer: Medicaid Other | Attending: Obstetrics & Gynecology | Admitting: Obstetrics & Gynecology

## 2019-04-30 ENCOUNTER — Encounter (HOSPITAL_BASED_OUTPATIENT_CLINIC_OR_DEPARTMENT_OTHER)
Admission: RE | Disposition: A | Discharge status: Home / Self Care | Payer: Self-pay | Source: Home / Self Care | Attending: Obstetrics & Gynecology

## 2019-04-30 ENCOUNTER — Ambulatory Visit (HOSPITAL_BASED_OUTPATIENT_CLINIC_OR_DEPARTMENT_OTHER): Payer: Medicaid Other | Admitting: Anesthesiology

## 2019-04-30 ENCOUNTER — Encounter (HOSPITAL_BASED_OUTPATIENT_CLINIC_OR_DEPARTMENT_OTHER): Payer: Self-pay | Admitting: Obstetrics & Gynecology

## 2019-04-30 DIAGNOSIS — N938 Other specified abnormal uterine and vaginal bleeding: Secondary | ICD-10-CM | POA: Diagnosis present

## 2019-04-30 DIAGNOSIS — Z9889 Other specified postprocedural states: Secondary | ICD-10-CM

## 2019-04-30 DIAGNOSIS — F17290 Nicotine dependence, other tobacco product, uncomplicated: Secondary | ICD-10-CM | POA: Diagnosis not present

## 2019-04-30 DIAGNOSIS — D259 Leiomyoma of uterus, unspecified: Secondary | ICD-10-CM | POA: Insufficient documentation

## 2019-04-30 DIAGNOSIS — N838 Other noninflammatory disorders of ovary, fallopian tube and broad ligament: Secondary | ICD-10-CM | POA: Insufficient documentation

## 2019-04-30 HISTORY — PX: VAGINAL HYSTERECTOMY: SHX2639

## 2019-04-30 HISTORY — DX: Anemia, unspecified: D64.9

## 2019-04-30 HISTORY — DX: Abnormal uterine and vaginal bleeding, unspecified: N93.9

## 2019-04-30 SURGERY — HYSTERECTOMY, VAGINAL
Anesthesia: General | Laterality: Bilateral

## 2019-04-30 MED ORDER — IBUPROFEN 800 MG PO TABS: 800.0000 mg | ORAL_TABLET | Freq: Four times a day (QID) | ORAL | Status: DC

## 2019-04-30 MED ORDER — PANTOPRAZOLE SODIUM 40 MG PO TBEC: 40.0000 mg | DELAYED_RELEASE_TABLET | Freq: Every day | ORAL | Status: DC

## 2019-04-30 MED ORDER — SCOPOLAMINE 1 MG/3DAYS TD PT72
MEDICATED_PATCH | TRANSDERMAL | Status: DC | PRN
  Administered 2019-04-30: 1 via TRANSDERMAL

## 2019-04-30 MED ORDER — FENTANYL CITRATE (PF) 100 MCG/2ML IJ SOLN
INTRAMUSCULAR | Status: AC
  Filled 2019-04-30: qty 2

## 2019-04-30 MED ORDER — SOD CITRATE-CITRIC ACID 500-334 MG/5ML PO SOLN
30.0000 mL | ORAL | Status: AC
  Administered 2019-04-30: 07:00:00 30 mL via ORAL

## 2019-04-30 MED ORDER — LACTATED RINGERS IV SOLN: INTRAVENOUS | Status: DC

## 2019-04-30 MED ORDER — KETOROLAC TROMETHAMINE 30 MG/ML IJ SOLN: 30.0000 mg | Freq: Four times a day (QID) | INTRAMUSCULAR | Status: DC

## 2019-04-30 MED ORDER — DOCUSATE SODIUM 100 MG PO CAPS: 100.0000 mg | ORAL_CAPSULE | Freq: Two times a day (BID) | ORAL | 0 refills | Status: AC

## 2019-04-30 MED ORDER — ONDANSETRON HCL 4 MG PO TABS: 4.0000 mg | ORAL_TABLET | Freq: Four times a day (QID) | ORAL | Status: DC | PRN

## 2019-04-30 MED ORDER — HYDROMORPHONE HCL 1 MG/ML IJ SOLN
INTRAMUSCULAR | Status: AC
  Filled 2019-04-30: qty 0.5

## 2019-04-30 MED ORDER — SOD CITRATE-CITRIC ACID 500-334 MG/5ML PO SOLN
ORAL | Status: AC
  Filled 2019-04-30: qty 30

## 2019-04-30 MED ORDER — LIDOCAINE 2% (20 MG/ML) 5 ML SYRINGE
INTRAMUSCULAR | Status: DC | PRN
  Administered 2019-04-30: 80 mg via INTRAVENOUS

## 2019-04-30 MED ORDER — ONDANSETRON HCL 4 MG/2ML IJ SOLN: 4.0000 mg | Freq: Four times a day (QID) | INTRAMUSCULAR | Status: DC | PRN

## 2019-04-30 MED ORDER — BUPIVACAINE HCL (PF) 0.5 % IJ SOLN
INTRAMUSCULAR | Status: DC | PRN
  Administered 2019-04-30: 20 mL

## 2019-04-30 MED ORDER — MIDAZOLAM HCL 5 MG/5ML IJ SOLN
INTRAMUSCULAR | Status: DC | PRN
  Administered 2019-04-30: 2 mg via INTRAVENOUS

## 2019-04-30 MED ORDER — OXYCODONE-ACETAMINOPHEN 5-325 MG PO TABS
1.0000 | ORAL_TABLET | ORAL | Status: DC | PRN
  Administered 2019-04-30: 17:00:00 1 via ORAL
  Filled 2019-04-30: qty 1

## 2019-04-30 MED ORDER — BISACODYL 10 MG RE SUPP: 10.0000 mg | Freq: Every day | RECTAL | Status: DC | PRN

## 2019-04-30 MED ORDER — BUPIVACAINE HCL (PF) 0.5 % IJ SOLN
INTRAMUSCULAR | Status: AC
  Filled 2019-04-30: qty 30

## 2019-04-30 MED ORDER — DEXTROSE-NACL 5-0.45 % IV SOLN: INTRAVENOUS | Status: AC

## 2019-04-30 MED ORDER — ONDANSETRON HCL 4 MG/2ML IJ SOLN
INTRAMUSCULAR | Status: AC
  Filled 2019-04-30: qty 2

## 2019-04-30 MED ORDER — DOCUSATE SODIUM 100 MG PO CAPS: 100.0000 mg | ORAL_CAPSULE | Freq: Two times a day (BID) | ORAL | Status: DC

## 2019-04-30 MED ORDER — ONDANSETRON HCL 4 MG/2ML IJ SOLN
INTRAMUSCULAR | Status: DC | PRN
  Administered 2019-04-30 (×2): 4 mg via INTRAVENOUS

## 2019-04-30 MED ORDER — SENNOSIDES-DOCUSATE SODIUM 8.6-50 MG PO TABS: 1.0000 | ORAL_TABLET | Freq: Every evening | ORAL | Status: DC | PRN

## 2019-04-30 MED ORDER — CEFAZOLIN SODIUM-DEXTROSE 2-4 GM/100ML-% IV SOLN
2.0000 g | INTRAVENOUS | Status: AC
  Administered 2019-04-30: 2 g via INTRAVENOUS

## 2019-04-30 MED ORDER — SIMETHICONE 80 MG PO CHEW: 80.0000 mg | CHEWABLE_TABLET | Freq: Four times a day (QID) | ORAL | Status: DC | PRN

## 2019-04-30 MED ORDER — LIDOCAINE 2% (20 MG/ML) 5 ML SYRINGE
INTRAMUSCULAR | Status: AC
  Filled 2019-04-30: qty 5

## 2019-04-30 MED ORDER — GABAPENTIN 300 MG PO CAPS
300.0000 mg | ORAL_CAPSULE | ORAL | Status: AC
  Administered 2019-04-30: 07:00:00 300 mg via ORAL

## 2019-04-30 MED ORDER — GABAPENTIN 300 MG PO CAPS
ORAL_CAPSULE | ORAL | Status: AC
  Filled 2019-04-30: qty 1

## 2019-04-30 MED ORDER — CEFAZOLIN SODIUM-DEXTROSE 2-4 GM/100ML-% IV SOLN
INTRAVENOUS | Status: AC
  Filled 2019-04-30: qty 100

## 2019-04-30 MED ORDER — KETOROLAC TROMETHAMINE 30 MG/ML IJ SOLN
INTRAMUSCULAR | Status: AC
  Filled 2019-04-30: qty 1

## 2019-04-30 MED ORDER — ROCURONIUM BROMIDE 10 MG/ML (PF) SYRINGE
PREFILLED_SYRINGE | INTRAVENOUS | Status: DC | PRN
  Administered 2019-04-30: 60 mg via INTRAVENOUS

## 2019-04-30 MED ORDER — FENTANYL CITRATE (PF) 100 MCG/2ML IJ SOLN
INTRAMUSCULAR | Status: DC | PRN
  Administered 2019-04-30 (×4): 50 ug via INTRAVENOUS

## 2019-04-30 MED ORDER — ZOLPIDEM TARTRATE 5 MG PO TABS: 5.0000 mg | ORAL_TABLET | Freq: Every evening | ORAL | Status: DC | PRN

## 2019-04-30 MED ORDER — KETOROLAC TROMETHAMINE 30 MG/ML IJ SOLN
INTRAMUSCULAR | Status: DC | PRN
  Administered 2019-04-30: 30 mg via INTRAVENOUS

## 2019-04-30 MED ORDER — OXYCODONE-ACETAMINOPHEN 5-325 MG PO TABS: 1.0000 | ORAL_TABLET | ORAL | 0 refills | Status: AC | PRN

## 2019-04-30 MED ORDER — ACETAMINOPHEN 500 MG PO TABS
ORAL_TABLET | ORAL | Status: AC
  Filled 2019-04-30: qty 2

## 2019-04-30 MED ORDER — MIDAZOLAM HCL 2 MG/2ML IJ SOLN
INTRAMUSCULAR | Status: AC
  Filled 2019-04-30: qty 2

## 2019-04-30 MED ORDER — MEPERIDINE HCL 25 MG/ML IJ SOLN: 6.2500 mg | INTRAMUSCULAR | Status: DC | PRN

## 2019-04-30 MED ORDER — PROMETHAZINE HCL 25 MG/ML IJ SOLN: 6.2500 mg | INTRAMUSCULAR | Status: DC | PRN

## 2019-04-30 MED ORDER — ESTRADIOL 0.1 MG/GM VA CREA
TOPICAL_CREAM | VAGINAL | Status: AC
  Filled 2019-04-30: qty 42.5

## 2019-04-30 MED ORDER — OXYCODONE HCL 5 MG PO TABS: 5.0000 mg | ORAL_TABLET | Freq: Once | ORAL | Status: DC | PRN

## 2019-04-30 MED ORDER — IBUPROFEN 800 MG PO TABS: 800.0000 mg | ORAL_TABLET | Freq: Four times a day (QID) | ORAL | 0 refills | Status: AC

## 2019-04-30 MED ORDER — DEXAMETHASONE SODIUM PHOSPHATE 10 MG/ML IJ SOLN
INTRAMUSCULAR | Status: AC
  Filled 2019-04-30: qty 1

## 2019-04-30 MED ORDER — HYDROMORPHONE HCL 1 MG/ML IJ SOLN: 0.2000 mg | INTRAMUSCULAR | Status: DC | PRN

## 2019-04-30 MED ORDER — MENTHOL 3 MG MT LOZG: 1.0000 | LOZENGE | OROMUCOSAL | Status: DC | PRN

## 2019-04-30 MED ORDER — PROPOFOL 10 MG/ML IV BOLUS
INTRAVENOUS | Status: AC
  Filled 2019-04-30: qty 20

## 2019-04-30 MED ORDER — VASOPRESSIN 20 UNIT/ML IV SOLN
INTRAVENOUS | Status: DC | PRN
  Administered 2019-04-30: 20 mL via INTRAMUSCULAR

## 2019-04-30 MED ORDER — SUGAMMADEX SODIUM 200 MG/2ML IV SOLN
INTRAVENOUS | Status: DC | PRN
  Administered 2019-04-30: 190 mg via INTRAVENOUS

## 2019-04-30 MED ORDER — DEXAMETHASONE SODIUM PHOSPHATE 4 MG/ML IJ SOLN
INTRAMUSCULAR | Status: DC | PRN
  Administered 2019-04-30: 10 mg via INTRAVENOUS

## 2019-04-30 MED ORDER — SODIUM CHLORIDE (PF) 0.9 % IJ SOLN
INTRAMUSCULAR | Status: AC
  Filled 2019-04-30: qty 60

## 2019-04-30 MED ORDER — HYDROMORPHONE HCL 1 MG/ML IJ SOLN
0.2500 mg | INTRAMUSCULAR | Status: DC | PRN
  Administered 2019-04-30: 09:00:00 0.5 mg via INTRAVENOUS

## 2019-04-30 MED ORDER — OXYCODONE HCL 5 MG/5ML PO SOLN: 5.0000 mg | Freq: Once | ORAL | Status: DC | PRN

## 2019-04-30 MED ORDER — ACETAMINOPHEN 500 MG PO TABS
1000.0000 mg | ORAL_TABLET | ORAL | Status: AC
  Administered 2019-04-30: 07:00:00 1000 mg via ORAL

## 2019-04-30 MED ORDER — PROPOFOL 10 MG/ML IV BOLUS
INTRAVENOUS | Status: DC | PRN
  Administered 2019-04-30: 200 mg via INTRAVENOUS

## 2019-04-30 MED ORDER — VASOPRESSIN 20 UNIT/ML IV SOLN
INTRAVENOUS | Status: AC
  Filled 2019-04-30: qty 1

## 2019-04-30 SURGICAL SUPPLY — 20 items
CONT PATH 16OZ SNAP LID 3702 (MISCELLANEOUS) ×2 IMPLANT
GAUZE 4X4 16PLY RFD (DISPOSABLE) ×3 IMPLANT
GLOVE BIO SURGEON STRL SZ7 (GLOVE) ×3 IMPLANT
GLOVE BIOGEL PI IND STRL 6.5 (GLOVE) ×1 IMPLANT
GLOVE BIOGEL PI IND STRL 7.0 (GLOVE) ×3 IMPLANT
GLOVE BIOGEL PI INDICATOR 6.5 (GLOVE) ×2
GLOVE BIOGEL PI INDICATOR 7.0 (GLOVE) ×6
GOWN STRL REUS W/TWL LRG LVL3 (GOWN DISPOSABLE) ×10 IMPLANT
GOWN STRL REUS W/TWL XL LVL3 (GOWN DISPOSABLE) ×3 IMPLANT
NS IRRIG 1000ML POUR BTL (IV SOLUTION) ×3 IMPLANT
PACK VAGINAL WOMENS (CUSTOM PROCEDURE TRAY) ×3 IMPLANT
PAD OB MATERNITY 4.3X12.25 (PERSONAL CARE ITEMS) ×3 IMPLANT
PAD PREP 24X48 CUFFED NSTRL (MISCELLANEOUS) ×3 IMPLANT
SLEEVE SCD COMPRESS KNEE MED (MISCELLANEOUS) ×3 IMPLANT
SUT VIC AB 0 CT1 18XCR BRD8 (SUTURE) ×3 IMPLANT
SUT VIC AB 0 CT1 36 (SUTURE) ×4 IMPLANT
SUT VIC AB 0 CT1 8-18 (SUTURE) ×4
SUT VICRYL 0 TIES 12 18 (SUTURE) ×3 IMPLANT
TOWEL GREEN STERILE FF (TOWEL DISPOSABLE) ×6 IMPLANT
TRAY FOLEY W/BAG SLVR 14FR LF (SET/KITS/TRAYS/PACK) ×3 IMPLANT

## 2019-04-30 NOTE — Op Note (Signed)
04/30/2019  8:47 AM  PATIENT:  Savannah Mayer  38 y.o. female  PRE-OPERATIVE DIAGNOSIS:  ABNORMAL UTERINE BLEEDING  POST-OPERATIVE DIAGNOSIS:  ABNORMAL UTERINE BLEEDING  PROCEDURE:  Procedure(s): HYSTERECTOMY VAGINAL WITH BILATERAL SALPINGECTOMY (Bilateral)  SURGEON:  Surgeon(s) and Role:    * Lavonia Drafts, MD - Primary    * Dove, Myra C, MD  ANESTHESIA:   general and paracervical block  EBL:  50 mL   BLOOD ADMINISTERED:none  DRAINS: none   LOCAL MEDICATIONS USED:  MARCAINE    and OTHER dilute vasopressin solution   SPECIMEN: uterus and fallopian tubes   DISPOSITION OF SPECIMEN:  PATHOLOGY  COUNTS:  YES  TOURNIQUET:  * No tourniquets in log *  DICTATION: .Note written in EPIC  PLAN OF CARE: admit for prolonged observation then discharge later today    PATIENT DISPOSITION:  PACU - hemodynamically stable.   Delay start of Pharmacological VTE agent (>24hrs) due to surgical blood loss or risk of bleeding: not applicable  Complications: none immediate   INDICATIONS: The patient is a 38 y.o. [id number] with history of menorrhagia. The patient made a decision to undergo definite surgical treatment. On the preoperative visit, the risks, benefits, indications, and alternatives of the procedure were reviewed with the patient.  On the day of surgery, the risks of surgery were again discussed with the patient including but not limited to: bleeding which may require transfusion or reoperation; infection which may require antibiotics; injury to bowel, bladder, ureters or other surrounding organs; need for additional procedures; thromboembolic phenomenon, incisional problems and other postoperative/anesthesia complications. Written informed consent was obtained.    OPERATIVE FINDINGS: A 10 week size uterus with normal tubes and ovaries bilaterally.  DESCRIPTION OF PROCEDURE:  The patient received intravenous antibiotics and had sequential compression devices applied to her  lower extremities while in the preoperative area.  She was then taken to the operating room where general anesthesia was administered and was found to be adequate.  She was placed in the dorsal lithotomy position, and was prepped and draped in a sterile manner.  The patients bladder was drained with a red rubber catheter. After an adequate timeout was performed, attention was turned to her pelvis.  A weighted speculum was then placed in the vagina, and the anterior and posterior lips of the cervix were grasped bilaterally with tenaculums.  The cervix was then injected circumferentially with a dilute Vasopression solution.  The cervix was then circumferentially incised, and the bladder was dissected off the pubocervical fascia without complication.  Th posterior cul-de-sac was entered sharply without difficulty. A suture was placed on the posterior vagina.  A long weighted speculum was inserted into the posterior cul-de-sac.  The Heaney clamp was then used to clamp the uterosacral ligaments on either side.  They were then cut and sutured ligated with 0 Vicryl, and the ligated uterosacral ligaments were transfixed to the posterior lateral vaginal epithelium to further support the vagina and provide hemostasis. Of note, all sutures used in this case were 0 Vicryl unless otherwise noted.   The cardinal ligaments were then clamped, cut and ligated. The anterior cul-de-sac was then entered sharpely. The uterine vessels and broad ligaments were then serially clamped with the Heaney clamps, cut, and suture ligated on both sides.  Excellent hemostasis was noted at this point.  Due to the size of the uterus, it was morcellated using a coring technique.  The uterus was then delivered via the posterior cul-de-sac, and the cornua were clamped with the  Heaney clamps, transected, and the uterus was delivered and sent to pathology. These pedicles were then suture ligated to ensure hemostasis.  After completion of the hysterectomy,  The fallopian tube on the left side was grasped with a Kelly clamp, transected and suture ligated a similar process occurred on the right allowing for bilateral salpingectomy.  All pedicles from the uterosacral ligament to the cornua were examined hemostasis was confirmed.  The vaginal cuff was reefed in a running locked fashion then reapproximated using figure of eight sutures care was given to incorporate the uterosacral pedicles bilaterally.  All instruments were then removed from the pelvis. The patient tolerated the procedure well.  All instruments, needles, and sponge counts were correct x 2. The patient was taken to the recovery room in stable condition.     An experienced assistant was required given the standard of surgical care given the complexity of the case.  This assistant was needed for exposure, dissection, suctioning, retraction, instrument exchange, assisting with delivery with administration of fundal pressure, and for overall help during the procedure.  Allyn Bertoni L. Harraway-Smith, M.D., [facility]

## 2019-04-30 NOTE — Anesthesia Postprocedure Evaluation (Signed)
Anesthesia Post Note  Patient: Savannah Mayer  Procedure(s) Performed: HYSTERECTOMY VAGINAL WITH BILATERAL SALPINGECTOMY (Bilateral )     Patient location during evaluation: PACU Anesthesia Type: General Level of consciousness: awake and alert Pain management: pain level controlled Vital Signs Assessment: post-procedure vital signs reviewed and stable Respiratory status: spontaneous breathing, nonlabored ventilation and respiratory function stable Cardiovascular status: blood pressure returned to baseline and stable Postop Assessment: no apparent nausea or vomiting Anesthetic complications: no    Last Vitals:  Vitals:   04/30/19 0945 04/30/19 1000  BP: 132/87 136/81  Pulse: 67 65  Resp: 12 16  Temp:  36.8 C  SpO2: 99% 100%    Last Pain:  Vitals:   04/30/19 1000  TempSrc:   PainSc: [name]

## 2019-04-30 NOTE — H&P (Addendum)
Preoperative History and Physical  Savannah Mayer is a 38 y.o. [id number] here for surgical management of AUB.  Proposed surgery: total vaginal hysterectomy with bilateral salpingectomy  Past Medical History:  Diagnosis Date  . Abnormal uterine bleeding (AUB)   . Anemia    Past Surgical History:  Procedure Laterality Date  . TUBAL LIGATION     OB History    Gravida  6   Para  5   Term  5   Preterm  0   AB  1   Living  5     SAB  1   TAB  0   Ectopic  0   Multiple  0   Live Births  5          Patient denies any cervical dysplasia or STIs. Medications Prior to Admission  Medication Sig Dispense Refill Last Dose  . BIOTIN PO Take 1 tablet by mouth daily.   04/23/2019 at Unknown time  . ferrous sulfate 325 (65 FE) MG tablet Take 1 tablet (325 mg total) by mouth daily. 30 tablet 3 04/23/2019 at Unknown time  . ibuprofen (ADVIL,MOTRIN) 200 MG tablet Take 200 mg by mouth every 6 (six) hours as needed for headache.   04/23/2019 at Unknown time  . megestrol (MEGACE) 40 MG tablet Take 1 tablet (40 mg total) by mouth 2 (two) times daily. Can increase to two tablets twice a day in the event of heavy bleeding 60 tablet 5 04/23/2019 at Unknown time  . naproxen sodium (ALEVE) 220 MG tablet Take 220 mg by mouth 2 (two) times daily as needed (for headache).   04/23/2019 at Unknown time  . megestrol (MEGACE) 40 MG tablet Take 1 tablet (40 mg total) by mouth 2 (two) times daily. (Patient not taking: Reported on 04/08/2019) 60 tablet 2     No Known Allergies Social History:   reports that she has been smoking cigars. She has never used smokeless tobacco. She reports current alcohol use. She reports that she does not use drugs. Family History  Problem Relation Age of Onset  . Anemia Mother   . Healthy Mother   . Breast cancer Maternal Aunt     Review of Systems: Noncontributory  PHYSICAL EXAM: Height 6\' 1"  (1.854 m), weight 90.3 kg, last menstrual period 02/16/2019. General  appearance - alert, well appearing, and in no distress Chest - clear to auscultation, no wheezes, rales or rhonchi, symmetric air entry Heart - normal rate and regular rhythm Abdomen - soft, nontender, nondistended, no masses or organomegaly Pelvic - examination not indicated Extremities - peripheral pulses normal, no pedal edema, no clubbing or cyanosis  Labs: Results for orders placed or performed during the hospital encounter of 04/30/19 (from the past 336 hour(s))  Type and screen [place]   Collection Time: 04/29/19  1:45 PM  Result Value Ref Range   ABO/RH(D) O POS    Antibody Screen POS    Sample Expiration 05/02/2019,2359    Antibody Identification      NO CLINICALLY SIGNIFICANT ANTIBODY IDENTIFIED Performed at [facility], [address] [place], [place] [address]    Unit Number [id number]    Blood Component Type RED CELLS,LR    Unit division 00    Status of Unit ALLOCATED    Transfusion Status OK TO TRANSFUSE    Crossmatch Result COMPATIBLE    Unit Number [id number]    Blood Component Type RED CELLS,LR    Unit division 00  Status of Unit ALLOCATED    Transfusion Status OK TO TRANSFUSE    Crossmatch Result COMPATIBLE   BPAM RBC   Collection Time: 04/29/19  1:45 PM  Result Value Ref Range   Blood Product Unit Number [id number]    PRODUCT CODE E0382V00    Unit Type and Rh 5100    Blood Product Expiration Date [id number]    Blood Product Unit Number [id number]    PRODUCT CODE E0336V00    Unit Type and Rh 5100    Blood Product Expiration Date [id number]   CBC   Collection Time: 04/29/19  3:09 PM  Result Value Ref Range   WBC 13.0 (H) 4.0 - 10.5 K/uL   RBC 4.95 3.87 - 5.11 MIL/uL   Hemoglobin 13.6 12.0 - 15.0 g/dL   HCT 41.5 36.0 - 46.0 %   MCV 83.8 80.0 - 100.0 fL   MCH 27.5 26.0 - 34.0 pg   MCHC 32.8 30.0 - 36.0 g/dL   RDW 15.9 (H) 11.5 - 15.5 %   Platelets 372 150 - 400 K/uL   nRBC 0.0 0.0 - 0.2 %  Results for  orders placed or performed during the hospital encounter of 04/29/19 (from the past 336 hour(s))  Pregnancy, urine POC   Collection Time: 04/29/19  2:29 PM  Result Value Ref Range   Preg Test, Ur NEGATIVE NEGATIVE  Results for orders placed or performed during the hospital encounter of 04/27/19 (from the past 336 hour(s))  Novel Coronavirus, NAA (Hosp order, Send-out to [facility]; TAT 18-24 hrs   Collection Time: 04/27/19  9:44 AM   Specimen: Nasopharyngeal Swab; Respiratory  Result Value Ref Range   SARS-CoV-2, NAA NOT DETECTED NOT DETECTED   Coronavirus Source NASOPHARYNGEAL     Imaging Studies: 01/16/2019 CLINICAL DATA:  Initial evaluation for dysfunctional uterine bleeding for 3 weeks.  EXAM: TRANSABDOMINAL AND TRANSVAGINAL ULTRASOUND OF PELVIS  TECHNIQUE: Both transabdominal and transvaginal ultrasound examinations of the pelvis were performed. Transabdominal technique was performed for global imaging of the pelvis including uterus, ovaries, adnexal regions, and pelvic cul-de-sac. It was necessary to proceed with endovaginal exam following the transabdominal exam to visualize the uterus, endometrium, and ovaries.  COMPARISON:  None  FINDINGS: Uterus  Measurements: 9.8 x 5.5 x 6.0 cm = volume: 169.3 mL. No fibroids or other mass visualized.  Endometrium  Thickness: 11.0 mm.  No focal abnormality visualized.  Right ovary  Measurements: 2.6 x 1.7 x 1.2 cm = volume: 2.9 mL. Normal appearance/no adnexal mass.  Left ovary  Measurements: 4.6 x 3.6 x 3.9 cm = volume: 3.4 mL. 4.1 cm simple cyst with internal daughter cyst, most consistent with a normal physiologic follicular cyst.  Other findings  No abnormal free fluid.  IMPRESSION: 1. Endometrial stripe measures 11 mm in thickness. If bleeding remains unresponsive to hormonal or medical therapy, sonohysterogram should be considered for focal lesion work-up. (Ref: Radiological Reasoning: Algorithmic  Workup of Abnormal Vaginal Bleeding with Endovaginal Sonography and Sonohysterography. AJR [id number]). 2. 4.1 cm simple left ovarian cyst, most consistent with a normal physiologic follicular cyst. 3. Otherwise unremarkable and normal pelvic ultrasound.   Assessment: Patient Active Problem List   Diagnosis Date Noted  . Post-operative state 04/30/2019  . DUB (dysfunctional uterine bleeding) 01/15/2019    Plan: Patient will undergo surgical management with  total vaginal hysterectomy with bilateral salpingectomy.   The risks of surgery were discussed in detail with the patient including but not limited to: bleeding which may require transfusion  or reoperation; infection which may require antibiotics; injury to surrounding organs which may involve bowel, bladder, ureters ; need for additional procedures including laparoscopy or laparotomy; thromboembolic phenomenon, surgical site problems and other postoperative/anesthesia complications. Likelihood of success in alleviating the patient's condition was discussed. Routine postoperative instructions will be reviewed with the patient and her family in detail after surgery.  The patient concurred with the proposed plan, giving informed written consent for the surgery.  Patient has been NPO since last night she will remain NPO for procedure.  Anesthesia and OR aware.  Preoperative prophylactic antibiotics and SCDs ordered on call to the OR.  To OR when ready.  Abid Bolla L. Harraway-Smith, M.D., [facility] 04/30/2019 7:20 AM

## 2019-04-30 NOTE — Anesthesia Preprocedure Evaluation (Signed)
Anesthesia Evaluation  Patient identified by MRN, date of birth, ID band Patient awake    Reviewed: Allergy & Precautions, NPO status , Patient's Chart, lab work & pertinent test results  Airway Mallampati: II  TM Distance: >3 FB Neck ROM: Full    Dental no notable dental hx.    Pulmonary neg pulmonary ROS, Current Smoker and Patient abstained from smoking.,    Pulmonary exam normal breath sounds clear to auscultation       Cardiovascular negative cardio ROS Normal cardiovascular exam Rhythm:Regular Rate:Normal     Neuro/Psych negative neurological ROS  negative psych ROS   GI/Hepatic negative GI ROS, Neg liver ROS,   Endo/Other  negative endocrine ROS  Renal/GU negative Renal ROS  negative genitourinary   Musculoskeletal negative musculoskeletal ROS (+)   Abdominal   Peds negative pediatric ROS (+)  Hematology negative hematology ROS (+)   Anesthesia Other Findings   Reproductive/Obstetrics negative OB ROS                             Anesthesia Physical Anesthesia Plan  ASA: II  Anesthesia Plan: General   Post-op Pain Management:    Induction: Intravenous  PONV Risk Score and Plan: 2 and Ondansetron, Midazolam and Treatment may vary due to age or medical condition  Airway Management Planned: Oral ETT  Additional Equipment:   Intra-op Plan:   Post-operative Plan: Extubation in OR  Informed Consent: I have reviewed the patients History and Physical, chart, labs and discussed the procedure including the risks, benefits and alternatives for the proposed anesthesia with the patient or authorized representative who has indicated his/her understanding and acceptance.     Dental advisory given  Plan Discussed with: CRNA  Anesthesia Plan Comments:         Anesthesia Quick Evaluation

## 2019-04-30 NOTE — Discharge Instructions (Signed)
Vaginal Hysterectomy, Care After Refer to this sheet in the next few weeks. These instructions provide you with information about caring for yourself after your procedure. Your health care provider may also give you more specific instructions. Your treatment has been planned according to current medical practices, but problems sometimes occur. Call your health care provider if you have any problems or questions after your procedure. What can I expect after the procedure? After the procedure, it is common to have:  Pain.  Soreness and numbness in your incision areas.  Vaginal bleeding and discharge.  Constipation.  Temporary problems emptying the bladder.  Feelings of sadness or other emotions. Follow these instructions at home: Medicines  Take over-the-counter and prescription medicines only as told by your health care provider.  If you were prescribed an antibiotic medicine, take it as told by your health care provider. Do not stop taking the antibiotic even if you start to feel better.  Do not drive or operate heavy machinery while taking prescription pain medicine. Activity  Return to your normal activities as told by your health care provider. Ask your health care provider what activities are safe for you.  Get regular exercise as told by your health care provider. You may be told to take short walks every day and go farther each time.  Do not lift anything that is heavier than 10 lb (4.5 kg). General instructions   Do not put anything in your vagina for 6 weeks after your surgery or as told by your health care provider. This includes tampons and douches.  Do not have sex until your health care provider says you can.  Do not take baths, swim, or use a hot tub until your health care provider approves.  Drink enough fluid to keep your urine clear or pale yellow.  Do not drive for 24 hours if you were given a sedative.  Keep all follow-up visits as told by your health  care provider. This is important. Contact a health care provider if:  Your pain medicine is not helping.  You have a fever.  You have redness, swelling, or pain at your incision site.  You have blood, pus, or a bad-smelling discharge from your vagina.  You continue to have difficulty urinating. Get help right away if:  You have severe abdominal or back pain.  You have heavy bleeding from your vagina.  You have chest pain or shortness of breath. This information is not intended to replace advice given to you by your health care provider. Make sure you discuss any questions you have with your health care provider. Document Released: 08/22/2015 Document Revised: 12/22/2015 Document Reviewed: 05/15/2015 Elsevier Patient Education  [place] Instructions  Activity: Get plenty of rest for the remainder of the day. A responsible individual must stay with you for 24 hours following the procedure.  For the next 24 hours, DO NOT: -Drive a car -Paediatric nurse -Drink alcoholic beverages -Take any medication unless instructed by your physician -Make any legal decisions or sign important papers.  Meals: Start with liquid foods such as gelatin or soup. Progress to regular foods as tolerated. Avoid greasy, spicy, heavy foods. If nausea and/or vomiting occur, drink only clear liquids until the nausea and/or vomiting subsides. Call your physician if vomiting continues.  Special Instructions/Symptoms: Your throat may feel dry or sore from the anesthesia or the breathing tube placed in your throat during surgery. If this causes discomfort, gargle with  warm salt water. The discomfort should disappear within 24 hours.  If you had a scopolamine patch placed behind your ear for the management of post- operative nausea and/or vomiting:  1. The medication in the patch is effective for 72 hours, after which it should be removed.  Wrap patch in a tissue and  discard in the trash. Wash hands thoroughly with soap and water. 2. You may remove the patch earlier than 72 hours if you experience unpleasant side effects which may include dry mouth, dizziness or visual disturbances. 3. Avoid touching the patch. Wash your hands with soap and water after contact with the patch.

## 2019-04-30 NOTE — Brief Op Note (Signed)
04/30/2019  8:47 AM  PATIENT:  Savannah Mayer  38 y.o. female  PRE-OPERATIVE DIAGNOSIS:  ABNORMAL UTERINE BLEEDING  POST-OPERATIVE DIAGNOSIS:  ABNORMAL UTERINE BLEEDING  PROCEDURE:  Procedure(s): HYSTERECTOMY VAGINAL WITH BILATERAL SALPINGECTOMY (Bilateral)  SURGEON:  Surgeon(s) and Role:    * Lavonia Drafts, MD - Primary    * Dove, Myra C, MD  ANESTHESIA:   general and paracervical block  EBL:  50 mL   BLOOD ADMINISTERED:none  DRAINS: none   LOCAL MEDICATIONS USED:  MARCAINE    and OTHER dilute vasopressin solution   SPECIMEN: uterus and fallopian tubes   DISPOSITION OF SPECIMEN:  PATHOLOGY  COUNTS:  YES  TOURNIQUET:  * No tourniquets in log *  DICTATION: .Note written in EPIC  PLAN OF CARE: admit for prolonged observation then discharge later today    PATIENT DISPOSITION:  PACU - hemodynamically stable.   Delay start of Pharmacological VTE agent (>24hrs) due to surgical blood loss or risk of bleeding: not applicable  Complications: none immediate  Librado Guandique L. Harraway-Smith, M.D., [facility]

## 2019-04-30 NOTE — Anesthesia Procedure Notes (Signed)
Procedure Name: Intubation Date/Time: 04/30/2019 7:34 AM Performed by: [place] Marylen Zuk C, CRNA Pre-anesthesia Checklist: Patient identified, Emergency Drugs available, Suction available and Patient being monitored Patient Re-evaluated:Patient Re-evaluated prior to induction Oxygen Delivery Method: Circle system utilized Preoxygenation: Pre-oxygenation with 100% oxygen Induction Type: IV induction Ventilation: Mask ventilation without difficulty Laryngoscope Size: Mac and 3 Grade View: Grade I Tube type: Oral Tube size: 7.0 mm Number of attempts: 1 Airway Equipment and Method: Stylet and Oral airway Placement Confirmation: ETT inserted through vocal cords under direct vision,  positive ETCO2 and breath sounds checked- equal and bilateral Secured at: 20 cm Tube secured with: Tape Dental Injury: Teeth and Oropharynx as per pre-operative assessment

## 2019-04-30 NOTE — Transfer of Care (Signed)
Immediate Anesthesia Transfer of Care Note  Patient: Savannah Mayer  Procedure(s) Performed: HYSTERECTOMY VAGINAL WITH BILATERAL SALPINGECTOMY (Bilateral )  Patient Location: PACU  Anesthesia Type:General  Level of Consciousness: awake, alert  and oriented  Airway & Oxygen Therapy: Patient Spontanous Breathing and Patient connected to face mask oxygen  Post-op Assessment: Report given to RN and Post -op Vital signs reviewed and stable  Post vital signs: Reviewed and stable  Last Vitals:  Vitals Value Taken Time  BP 136/83 04/30/19 0853  Temp    Pulse 89 04/30/19 0855  Resp 17 04/30/19 0855  SpO2 100 % 04/30/19 0855  Vitals shown include unvalidated device data.  Last Pain:  Vitals:   04/30/19 0727  TempSrc: Temporal  PainSc: 0-No pain         Complications: No apparent anesthesia complications

## 2019-05-01 ENCOUNTER — Encounter: Payer: Self-pay | Admitting: *Deleted

## 2019-05-01 ENCOUNTER — Telehealth: Payer: Self-pay | Admitting: Obstetrics & Gynecology

## 2019-05-01 LAB — TYPE AND SCREEN
ABO/RH(D): O POS
Antibody Screen: POSITIVE
Unit division: 0
Unit division: 0

## 2019-05-01 LAB — BPAM RBC
Blood Product Expiration Date: 202101152359
Blood Product Expiration Date: 202101152359
Unit Type and Rh: 5100
Unit Type and Rh: 5100

## 2019-05-01 NOTE — Telephone Encounter (Signed)
TC to pt to check on her post op. Pt with no complaints. Pt reports adequate pain control.  Reviewed post op instructions. All questions answered.   Savannah Mayer L. Harraway-Smith, M.D., [facility]

## 2019-05-04 LAB — SURGICAL PATHOLOGY

## 2019-05-06 NOTE — Discharge Summary (Signed)
Physician Discharge Summary  Patient ID: Savannah Mayer MRN: [id number] DOB/AGE: [birth year] 38 y.o.  Admit date: 04/30/2019 Discharge date: 05/06/2019  Admission Diagnoses: AUB  Discharge Diagnoses:  Principal Problem:   Post-operative state Active Problems:   DUB (dysfunctional uterine bleeding)  Discharged Condition: good  Hospital Course: Patient had an uncomplicated surgery; for further details of this surgery, please refer to the operative note. Furthermore, the patient had an uncomplicated postoperative course.  By time of discharge, her pain was controlled on oral pain medications; she was ambulating, voiding without difficulty, tolerating regular diet and passing flatus.  She was deemed stable for discharge to home.    Significant Diagnostic Studies: labs: CBC  Treatments: surgery: total vaginal hysterectomy with bilateral salpingectomy.   Discharge Exam: Blood pressure 131/80, pulse 68, temperature 97.6 F (36.4 C), resp. rate 16, height 6\' 1"  (1.854 m), weight 92.5 kg, last menstrual period 02/16/2019, SpO2 99 %. General appearance: alert, appears stated age and no distress  Disposition: Discharge disposition: 01-Home or Self Care       Discharge Instructions    Call MD for:  difficulty breathing, headache or visual disturbances   Complete by: As directed    Call MD for:  extreme fatigue   Complete by: As directed    Call MD for:  persistant dizziness or light-headedness   Complete by: As directed    Call MD for:  persistant nausea and vomiting   Complete by: As directed    Call MD for:  redness, tenderness, or signs of infection (pain, swelling, redness, odor or green/yellow discharge around incision site)   Complete by: As directed    Call MD for:  severe uncontrolled pain   Complete by: As directed    Call MD for:  temperature >100.4   Complete by: As directed    Diet - low sodium heart healthy   Complete by: As directed    Diet general   Complete by: As  directed    Driving Restrictions   Complete by: As directed    No driving for 2 weeks   Increase activity slowly   Complete by: As directed    Lifting restrictions   Complete by: As directed    No lifting for 2 weeks. No heavy lifting for 6 weeks.   Sexual Activity Restrictions   Complete by: As directed    No intercourse for 6 weeks     Allergies as of 04/30/2019   No Known Allergies     Medication List    STOP taking these medications   ferrous sulfate 325 (65 FE) MG tablet     TAKE these medications   BIOTIN PO Take 1 tablet by mouth daily.   docusate sodium 100 MG capsule Commonly known as: COLACE Take 1 capsule (100 mg total) by mouth 2 (two) times daily.   ibuprofen 800 MG tablet Commonly known as: ADVIL Take 1 tablet (800 mg total) by mouth every 6 (six) hours.   oxyCODONE-acetaminophen 5-325 MG tablet Commonly known as: PERCOCET/ROXICET Take 1-2 tablets by mouth every 4 (four) hours as needed for moderate pain.      [place] In 2 weeks.   Specialty: Obstetrics and Gynecology Contact information: [place] [phone]          Signed: Lavonia Drafts 05/06/2019, 11:19 AM

## 2019-05-11 ENCOUNTER — Telehealth: Payer: Self-pay

## 2019-05-11 NOTE — Telephone Encounter (Signed)
Patient called stating that she has some light pink yellowish discharge yesterday. Patient states she had a partial hysterectomy on 04/30/2019. Patient denies any fever right now. 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 Made patient aware that she could have over done it since she was getting in and out of a tall vehicle and lifting presents. Will route to provider to see if she needs to be evaluated sooner. Kathrene Alu RN  Patient called back and Dr. Ihor Dow would recommend rest and follow up on Jan. 4th. Kathrene Alu RN

## 2019-05-18 ENCOUNTER — Encounter: Payer: Self-pay | Admitting: Obstetrics & Gynecology

## 2019-05-18 ENCOUNTER — Ambulatory Visit (INDEPENDENT_AMBULATORY_CARE_PROVIDER_SITE_OTHER): Payer: Medicaid Other | Admitting: Obstetrics & Gynecology

## 2019-05-18 ENCOUNTER — Other Ambulatory Visit: Payer: Self-pay

## 2019-05-18 VITALS — BP 133/84 | HR 80 | Ht 73.0 in | Wt 202.0 lb

## 2019-05-18 DIAGNOSIS — Z9889 Other specified postprocedural states: Secondary | ICD-10-CM

## 2019-05-18 NOTE — Patient Instructions (Signed)
Vaginal Hysterectomy, Care After Refer to this sheet in the next few weeks. These instructions provide you with information about caring for yourself after your procedure. Your health care provider may also give you more specific instructions. Your treatment has been planned according to current medical practices, but problems sometimes occur. Call your health care provider if you have any problems or questions after your procedure. What can I expect after the procedure? After the procedure, it is common to have:  Pain.  Soreness and numbness in your incision areas.  Vaginal bleeding and discharge.  Constipation.  Temporary problems emptying the bladder.  Feelings of sadness or other emotions. Follow these instructions at home: Medicines  Take over-the-counter and prescription medicines only as told by your health care provider.  If you were prescribed an antibiotic medicine, take it as told by your health care provider. Do not stop taking the antibiotic even if you start to feel better.  Do not drive or operate heavy machinery while taking prescription pain medicine. Activity  Return to your normal activities as told by your health care provider. Ask your health care provider what activities are safe for you.  Get regular exercise as told by your health care provider. You may be told to take short walks every day and go farther each time.  Do not lift anything that is heavier than 10 lb (4.5 kg). General instructions   Do not put anything in your vagina for 6 weeks after your surgery or as told by your health care provider. This includes tampons and douches.  Do not have sex until your health care provider says you can.  Do not take baths, swim, or use a hot tub until your health care provider approves.  Drink enough fluid to keep your urine clear or pale yellow.  Do not drive for 24 hours if you were given a sedative.  Keep all follow-up visits as told by your health  care provider. This is important. Contact a health care provider if:  Your pain medicine is not helping.  You have a fever.  You have redness, swelling, or pain at your incision site.  You have blood, pus, or a bad-smelling discharge from your vagina.  You continue to have difficulty urinating. Get help right away if:  You have severe abdominal or back pain.  You have heavy bleeding from your vagina.  You have chest pain or shortness of breath. This information is not intended to replace advice given to you by your health care provider. Make sure you discuss any questions you have with your health care provider. Document Revised: 12/22/2015 Document Reviewed: 05/15/2015 Elsevier Patient Education  2020 [employer]  

## 2019-05-18 NOTE — Progress Notes (Signed)
Post op visit- 3 weeks post op. Kathrene Alu RN

## 2019-05-18 NOTE — Progress Notes (Signed)
History:  39 y.o. [id number] here today for 3 week post op check following TVH with bilateral salpingectomy. Pt reports no bleeding but, occ discharge which is 'creamy with a very very sl odor at times'. Nothing currently. Voiding and passing stools WNL.   The following portions of the patient's history were reviewed and updated as appropriate: allergies, current medications, past family history, past medical history, past social history, past surgical history and problem list.  Review of Systems:  Pertinent items are noted in HPI.    Objective:  Physical Exam Blood pressure 133/84, pulse 80, height 6\' 1"  (1.854 m), weight 202 lb (91.6 kg).  CONSTITUTIONAL: Well-developed, well-nourished female in no acute distress.  HENT:  Normocephalic, atraumatic EYES: Conjunctivae and EOM are normal. No scleral icterus.  NECK: Normal range of motion SKIN: Skin is warm and dry. No rash noted. Not diaphoretic.No pallor. [place]: Alert and oriented to person, place, and time. Normal coordination.  Abd: Soft, nontender and nondistended. Pt has on tight jeans!  Pelvic: deferred  Labs and Imaging 04/30/2019 FINAL MICROSCOPIC DIAGNOSIS:   A. UTERUS, CERVIX, BILATERAL FALLOPIAN TUBES, HYSTERECTOMY:  - Cervix    Squamous metaplasia.    No dysplasia or malignancy.  - Endometrium    Secretory.    No hyperplasia or malignancy.  - Myometrium    Leiomyomata with focal degenerative changes.    No malignancy identified.  -Bilateral Fallopian tube    Bilateral adhesions.    Bilateral benign paratubal cysts.    No endometriosis or malignancy.   Assessment & Plan:  3 week post from [facility] doing well  Reviewed post op activities  GRADUAL increase in activities   Counseled aostisnt doing too much  F/u in 3 weeks   No intercourse until after next visit and 6 weeks post op   Maneh Sieben L. Harraway-Smith, M.D., Cherlynn June

## 2019-06-08 ENCOUNTER — Ambulatory Visit: Payer: Medicaid Other | Admitting: Obstetrics & Gynecology

## 2019-06-10 ENCOUNTER — Other Ambulatory Visit (HOSPITAL_COMMUNITY)
Admission: RE | Admit: 2019-06-10 | Discharge: 2019-06-10 | Disposition: A | Discharge status: Home / Self Care | Payer: Medicaid Other | Source: Ambulatory Visit | Attending: Obstetrics & Gynecology | Admitting: Obstetrics & Gynecology

## 2019-06-10 ENCOUNTER — Other Ambulatory Visit: Payer: Self-pay

## 2019-06-10 ENCOUNTER — Ambulatory Visit (INDEPENDENT_AMBULATORY_CARE_PROVIDER_SITE_OTHER): Payer: Medicaid Other | Admitting: Obstetrics & Gynecology

## 2019-06-10 ENCOUNTER — Encounter: Payer: Self-pay | Admitting: Obstetrics & Gynecology

## 2019-06-10 VITALS — BP 117/78 | HR 91 | Ht 73.0 in | Wt 206.0 lb

## 2019-06-10 DIAGNOSIS — Z9889 Other specified postprocedural states: Secondary | ICD-10-CM | POA: Diagnosis not present

## 2019-06-10 DIAGNOSIS — N898 Other specified noninflammatory disorders of vagina: Secondary | ICD-10-CM | POA: Insufficient documentation

## 2019-06-10 DIAGNOSIS — B9689 Other specified bacterial agents as the cause of diseases classified elsewhere: Secondary | ICD-10-CM

## 2019-06-10 MED ORDER — METRONIDAZOLE 500 MG PO TABS: 500.0000 mg | ORAL_TABLET | Freq: Two times a day (BID) | ORAL | 0 refills | Status: AC

## 2019-06-10 NOTE — Patient Instructions (Signed)
Vaginal Hysterectomy, Care After Refer to this sheet in the next few weeks. These instructions provide you with information about caring for yourself after your procedure. Your health care provider may also give you more specific instructions. Your treatment has been planned according to current medical practices, but problems sometimes occur. Call your health care provider if you have any problems or questions after your procedure. What can I expect after the procedure? After the procedure, it is common to have:  Pain.  Soreness and numbness in your incision areas.  Vaginal bleeding and discharge.  Constipation.  Temporary problems emptying the bladder.  Feelings of sadness or other emotions. Follow these instructions at home: Medicines  Take over-the-counter and prescription medicines only as told by your health care provider.  If you were prescribed an antibiotic medicine, take it as told by your health care provider. Do not stop taking the antibiotic even if you start to feel better.  Do not drive or operate heavy machinery while taking prescription pain medicine. Activity  Return to your normal activities as told by your health care provider. Ask your health care provider what activities are safe for you.  Get regular exercise as told by your health care provider. You may be told to take short walks every day and go farther each time.  Do not lift anything that is heavier than 10 lb (4.5 kg). General instructions   Do not put anything in your vagina for 6 weeks after your surgery or as told by your health care provider. This includes tampons and douches.  Do not have sex until your health care provider says you can.  Do not take baths, swim, or use a hot tub until your health care provider approves.  Drink enough fluid to keep your urine clear or pale yellow.  Do not drive for 24 hours if you were given a sedative.  Keep all follow-up visits as told by your health  care provider. This is important. Contact a health care provider if:  Your pain medicine is not helping.  You have a fever.  You have redness, swelling, or pain at your incision site.  You have blood, pus, or a bad-smelling discharge from your vagina.  You continue to have difficulty urinating. Get help right away if:  You have severe abdominal or back pain.  You have heavy bleeding from your vagina.  You have chest pain or shortness of breath. This information is not intended to replace advice given to you by your health care provider. Make sure you discuss any questions you have with your health care provider. Document Revised: 12/22/2015 Document Reviewed: 05/15/2015 Elsevier Patient Education  2020 [employer]  

## 2019-06-10 NOTE — Progress Notes (Signed)
History:  39 y.o. [id number] here today for post op check. Pt is s/p TVH with bilateral salpingectomy on 04/30/2019. Pt reports a slight fishy odor with increased discharge. She is otherwise without complaints. She denies pain. She is voiding and passing stools without difficulty.   The following portions of the patient's history were reviewed and updated as appropriate: allergies, current medications, past family history, past medical history, past social history, past surgical history and problem list.  Review of Systems:  Pertinent items are noted in HPI.    Objective:  Physical Exam Blood pressure 117/78, pulse 91, height 6\' 1"  (1.854 m), weight 206 lb (93.4 kg), last menstrual period 02/16/2019.  CONSTITUTIONAL: Well-developed, well-nourished female in no acute distress.  HENT:  Normocephalic, atraumatic EYES: Conjunctivae and EOM are normal. No scleral icterus.  NECK: Normal range of motion SKIN: Skin is warm and dry. No rash noted. Not diaphoretic.No pallor. [place]: Alert and oriented to person, place, and time. Normal coordination.  Abd: Soft, nontender and nondistended Pelvic: Normal appearing external genitalia; normal appearing vaginal mucosa with a small amount of granulation tissue at the cuff. This was managed with silver nitrate. Normal discharge.  No palpable masses or adnexal tenderness  Imaging 04/30/2019 UTERUS, CERVIX, BILATERAL FALLOPIAN TUBES, HYSTERECTOMY:  - Cervix    Squamous metaplasia.    No dysplasia or malignancy.  - Endometrium    Secretory.    No hyperplasia or malignancy.  - Myometrium    Leiomyomata with focal degenerative changes.    No malignancy identified.  -Bilateral Fallopian tube    Bilateral adhesions.    Bilateral benign paratubal cysts.    No endometriosis or malignancy.   Assessment & Plan:  5 weeks post op check- doing well.   Gradual return to full activities  Granulation tissue at vaginal cuff  Treated with  silver Nitrate     BV  Flagyl 500mg  bid x 7 days  F/u in 3 months or sooner prn  Savannah Mayer, M.D., Cherlynn June

## 2019-06-12 LAB — CERVICOVAGINAL ANCILLARY ONLY
Bacterial Vaginitis (gardnerella): POSITIVE — AB
Candida Glabrata: NEGATIVE
Candida Vaginitis: NEGATIVE
Comment: NEGATIVE
Comment: NEGATIVE
Comment: NEGATIVE

## 2019-06-15 ENCOUNTER — Telehealth: Payer: Self-pay

## 2019-06-15 NOTE — Telephone Encounter (Signed)
Called pt regarding results. Pt made aware that she has BV.Medication was sent to her pharmacy. Understanding was voiced. Yoskar Murrillo l Rainah Kirshner, CMA

## 2019-06-16 ENCOUNTER — Other Ambulatory Visit: Payer: Self-pay | Admitting: Obstetrics & Gynecology

## 2019-06-17 ENCOUNTER — Other Ambulatory Visit (INDEPENDENT_AMBULATORY_CARE_PROVIDER_SITE_OTHER): Payer: Self-pay

## 2020-12-14 IMAGING — US US PELVIS COMPLETE WITH TRANSVAGINAL
1 series · 13 of 25 positions shown · non-contrast
Comparison: None

CLINICAL DATA: Initial evaluation for dysfunctional uterine
bleeding for 3 weeks.



[Series 1: us pelvis complete with transvaginal · 13 of 47 slices shown]
[im 1/47]
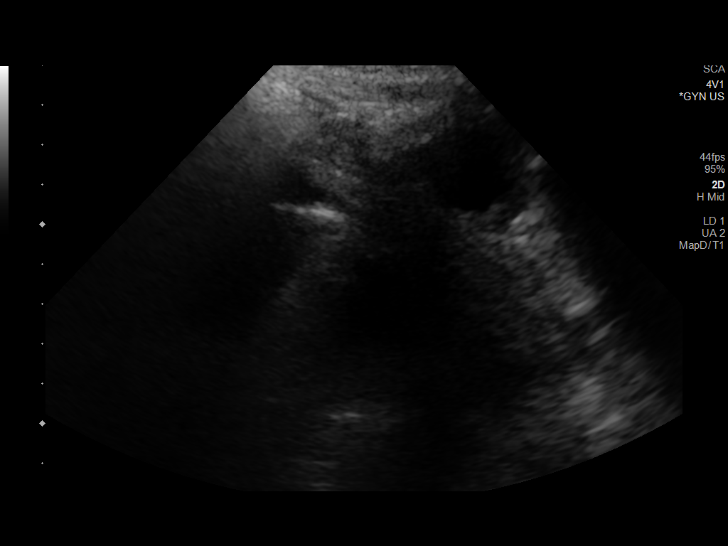
[im 4/47]
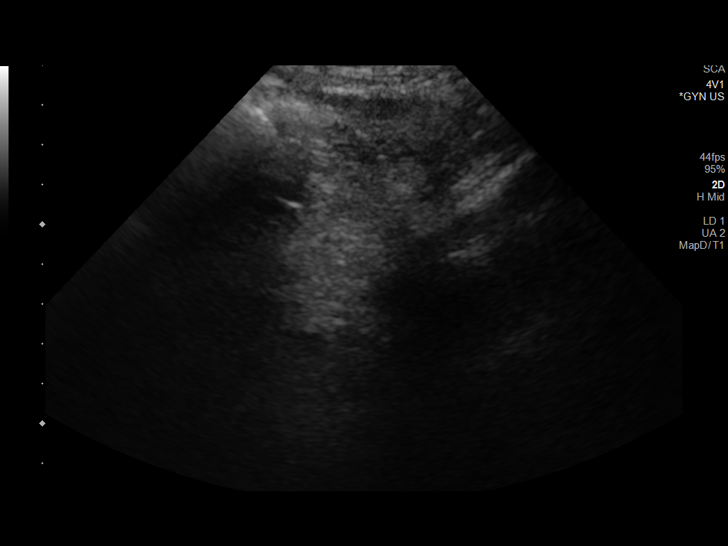
[im 8/47]
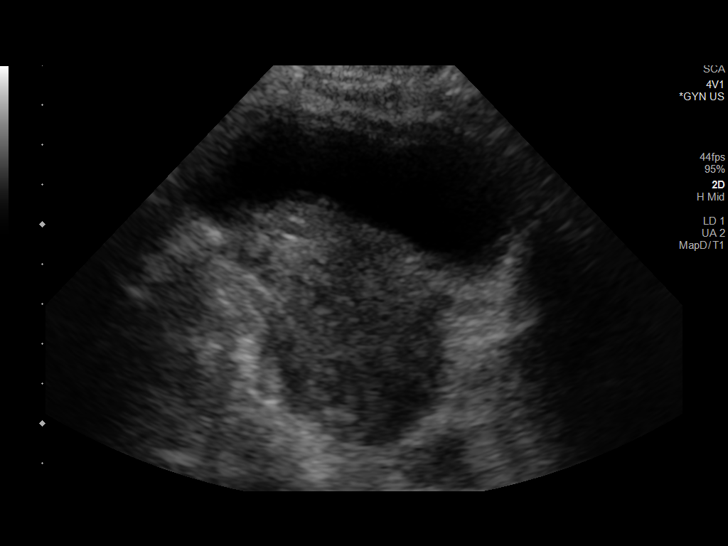
[im 12/47]
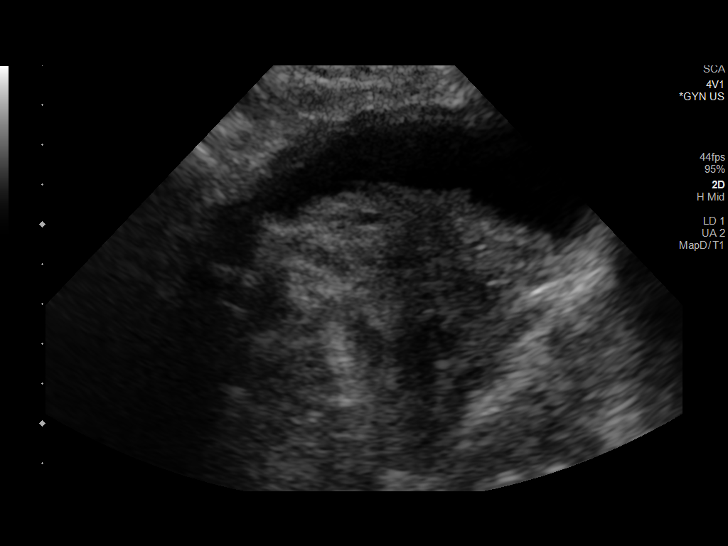
[im 16/47]
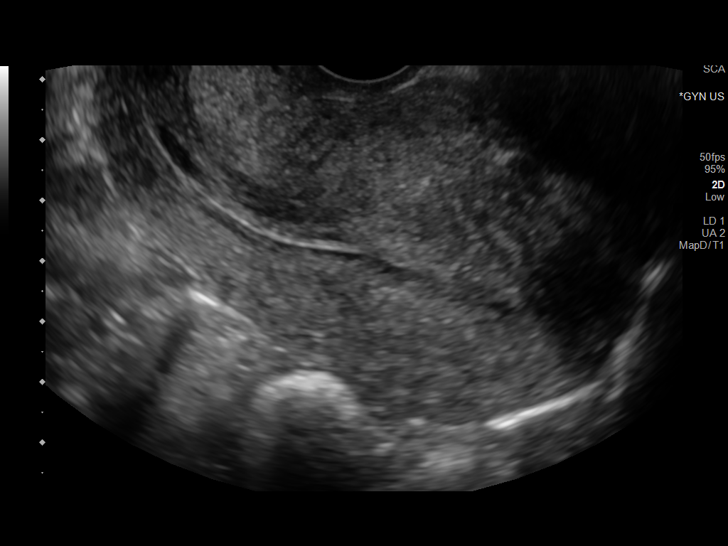
[im 20/47]
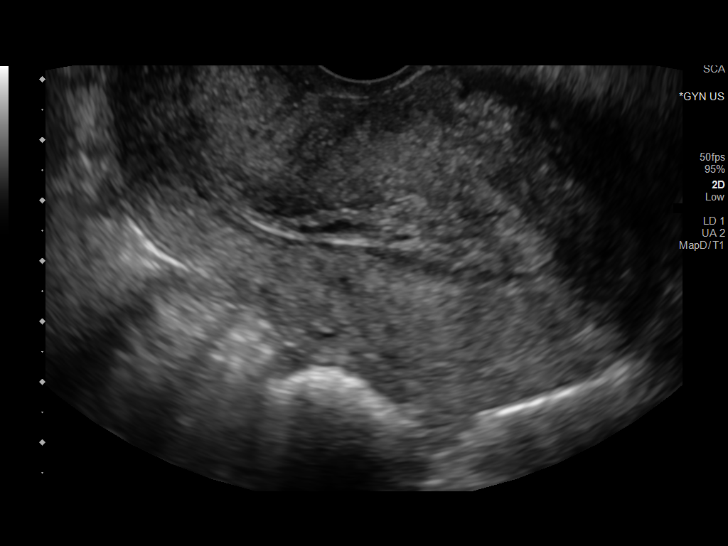
[im 24/47]
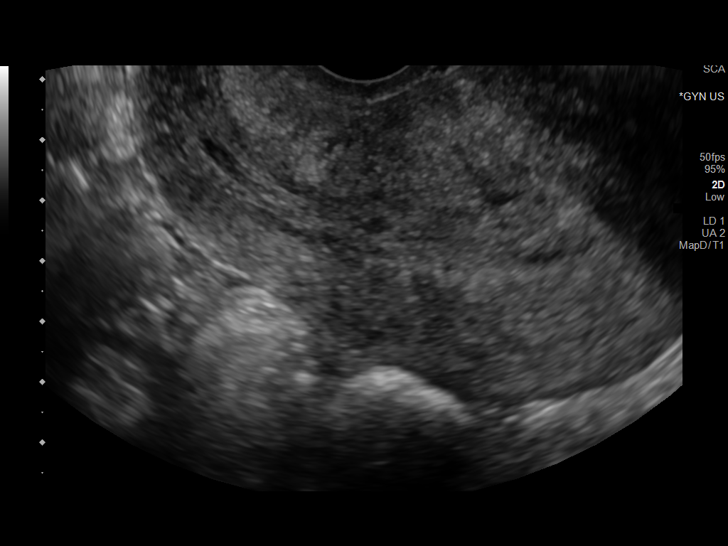
[im 27/47]
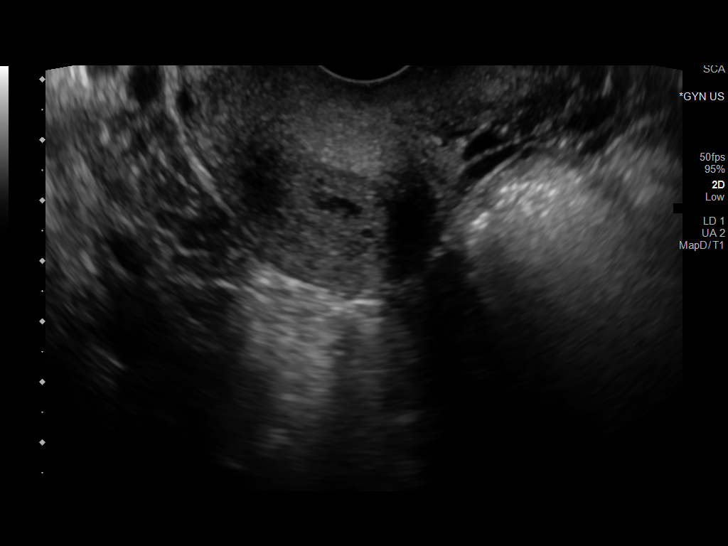
[im 31/47]
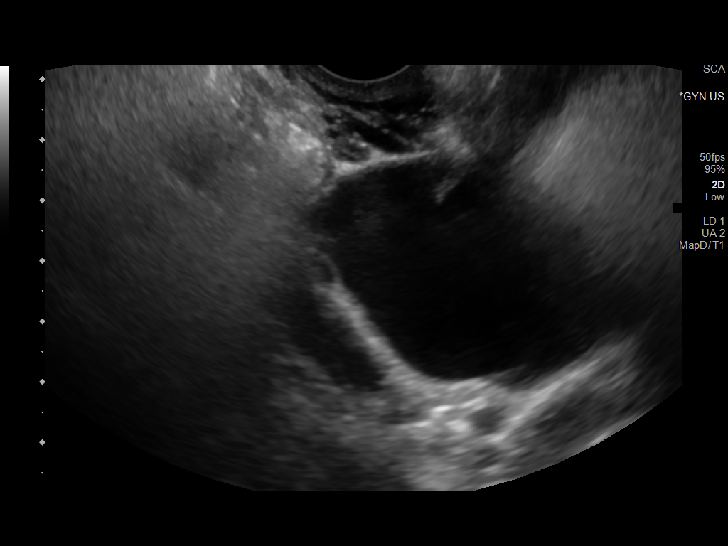
[im 35/47]
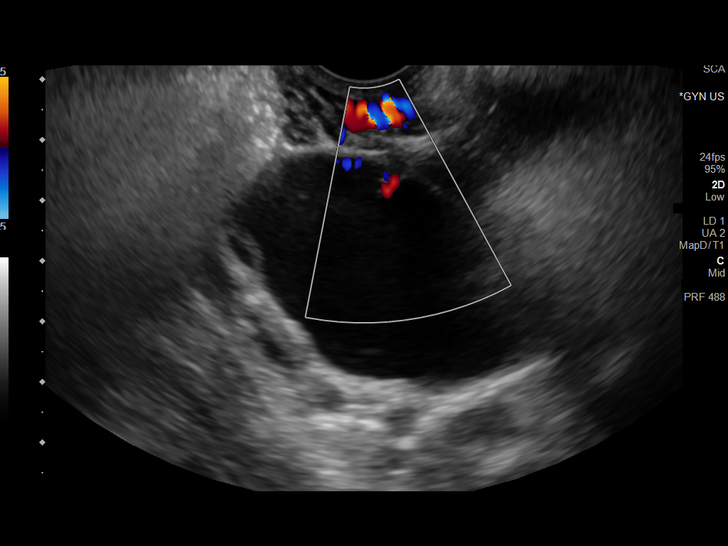
[im 39/47]
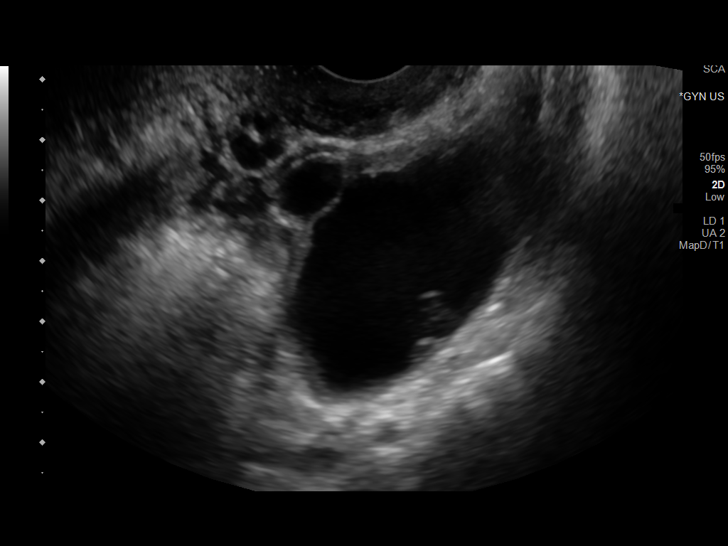
[im 43/47]
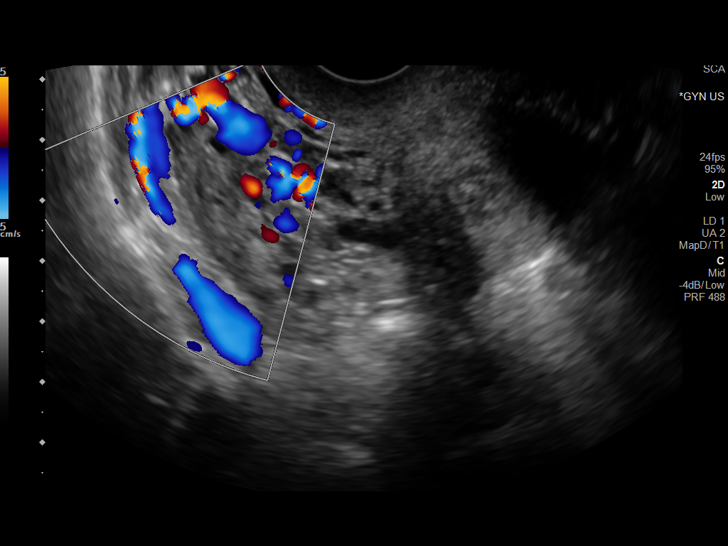
[im 47/47]
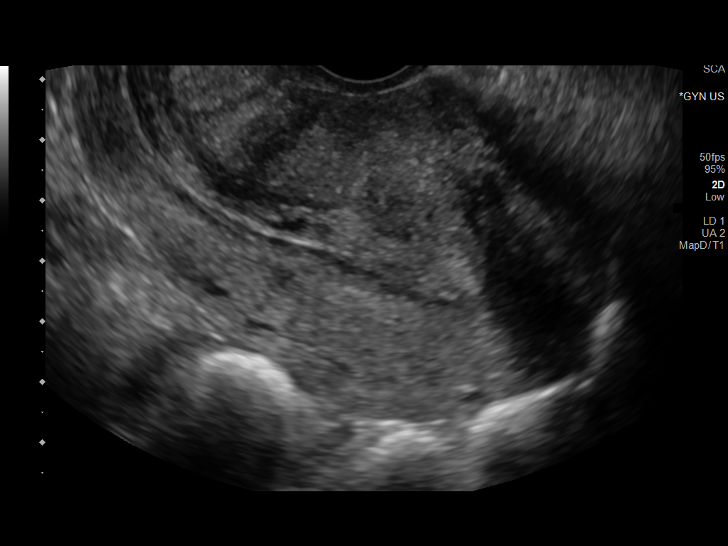

[13 of 25 positions shown; findings below may reference images not displayed]

FINDINGS: Uterus

Measurements: 9.8 x 5.5 x 6.0 cm = volume: 169.3 mL. No fibroids or
other mass visualized.

Endometrium

Thickness: 11.0 mm.  No focal abnormality visualized.

Right ovary

Measurements: 2.6 x 1.7 x 1.2 cm = volume: 2.9 mL. Normal
appearance/no adnexal mass.

Left ovary

Measurements: 4.6 x 3.6 x 3.9 cm = volume: 3.4 mL. 4.1 cm simple
cyst with internal daughter cyst, most consistent with a normal
physiologic follicular cyst.

Other findings

No abnormal free fluid.
IMPRESSION: 1. Endometrial stripe measures 11 mm in thickness. If bleeding
remains unresponsive to hormonal or medical therapy, sonohysterogram
should be considered for focal lesion work-up. (Ref: Radiological
Reasoning: Algorithmic Workup of Abnormal Vaginal Bleeding with
Endovaginal Sonography and Sonohysterography. AJR [id number]; [id number]:[id number]).
2. 4.1 cm simple left ovarian cyst, most consistent with a normal
physiologic follicular cyst.
3. Otherwise unremarkable and normal pelvic ultrasound.

## 2023-03-27 ENCOUNTER — Ambulatory Visit (HOSPITAL_COMMUNITY)
Admission: EM | Admit: 2023-03-27 | Discharge: 2023-03-27 | Discharge status: Against Medical Advice | Payer: Medicaid Other

## 2023-03-27 NOTE — ED Notes (Signed)
This RN called pt in lobby x 1. No answer. Receptionist at front desk reports pt has left. This RN called pt to see if she was waiting in her car, sent to voicemail.

## 2023-03-27 NOTE — ED Notes (Signed)
Pt called to treatment room x 3-- no answer.
